# Patient Record
Sex: Female | Born: 1985 | Race: Black or African American | Hispanic: No | Marital: Single | Smoking: Current every day smoker
Health system: Southern US, Community
[De-identification: ages and names within clinical notes are randomized; demographics above are authoritative.]

## PROBLEM LIST (undated history)

## (undated) DIAGNOSIS — I10 Essential (primary) hypertension: Secondary | ICD-10-CM

## (undated) DIAGNOSIS — Z789 Other specified health status: Secondary | ICD-10-CM

## (undated) DIAGNOSIS — B9689 Other specified bacterial agents as the cause of diseases classified elsewhere: Secondary | ICD-10-CM

## (undated) DIAGNOSIS — N760 Acute vaginitis: Secondary | ICD-10-CM

## (undated) HISTORY — PX: BREAST REDUCTION SURGERY: SHX8

---

## 2010-11-23 ENCOUNTER — Inpatient Hospital Stay (HOSPITAL_COMMUNITY)
Admission: AD | Admit: 2010-11-23 | Discharge: 2010-11-23 | Disposition: A | Discharge status: Home / Self Care | Payer: Medicaid Other | Source: Ambulatory Visit | Attending: Obstetrics and Gynecology | Admitting: Obstetrics and Gynecology

## 2010-11-23 ENCOUNTER — Inpatient Hospital Stay (HOSPITAL_COMMUNITY): Payer: Medicaid Other

## 2010-11-23 DIAGNOSIS — O209 Hemorrhage in early pregnancy, unspecified: Secondary | ICD-10-CM

## 2010-11-23 DIAGNOSIS — O4690 Antepartum hemorrhage, unspecified, unspecified trimester: Secondary | ICD-10-CM

## 2010-11-23 LAB — CBC
HCT: 35.9 % — ABNORMAL LOW (ref 36.0–46.0)
MCH: 28.8 pg (ref 26.0–34.0)
MCV: 86.9 fL (ref 78.0–100.0)
Platelets: 260 10*3/uL (ref 150–400)
RBC: 4.13 MIL/uL (ref 3.87–5.11)

## 2010-11-23 LAB — HCG, QUANTITATIVE, PREGNANCY: hCG, Beta Chain, Quant, S: 3380 m[IU]/mL — ABNORMAL HIGH (ref <5)

## 2010-11-23 LAB — WET PREP, GENITAL

## 2010-11-24 LAB — GC/CHLAMYDIA PROBE AMP, GENITAL
Chlamydia, DNA Probe: POSITIVE — AB
GC Probe Amp, Genital: NEGATIVE

## 2010-11-25 ENCOUNTER — Inpatient Hospital Stay (HOSPITAL_COMMUNITY)
Admission: AD | Admit: 2010-11-25 | Discharge: 2010-11-25 | Disposition: A | Discharge status: Home / Self Care | Payer: Medicaid Other | Source: Ambulatory Visit | Attending: Family Medicine | Admitting: Family Medicine

## 2010-11-25 DIAGNOSIS — O021 Missed abortion: Secondary | ICD-10-CM

## 2010-12-02 ENCOUNTER — Inpatient Hospital Stay (HOSPITAL_COMMUNITY)
Admission: AD | Admit: 2010-12-02 | Discharge: 2010-12-02 | Disposition: A | Discharge status: Home / Self Care | Payer: Medicaid Other | Source: Ambulatory Visit | Attending: Obstetrics & Gynecology | Admitting: Obstetrics & Gynecology

## 2010-12-02 DIAGNOSIS — O200 Threatened abortion: Secondary | ICD-10-CM

## 2010-12-20 ENCOUNTER — Encounter: Payer: Medicaid Other | Admitting: Obstetrics and Gynecology

## 2010-12-29 ENCOUNTER — Other Ambulatory Visit: Payer: Self-pay | Admitting: Advanced Practice Midwife

## 2010-12-29 ENCOUNTER — Encounter: Payer: Medicaid Other | Admitting: Advanced Practice Midwife

## 2010-12-29 DIAGNOSIS — O039 Complete or unspecified spontaneous abortion without complication: Secondary | ICD-10-CM

## 2010-12-29 NOTE — Group Therapy Note (Unsigned)
Kristine Gomez, Kristine Gomez                 ACCOUNT NO.:  [id number]  MEDICAL RECORD NO.:  [id number]           PATIENT TYPE:  A  LOCATION:  [facility]                   FACILITY:  [facility]  PHYSICIAN:  Wynelle Bourgeois, CNM    DATE OF BIRTH:  June 21, 1986  DATE OF SERVICE:  12/29/2010                                 CLINIC NOTE  This is a 25 year old gravida 1, para 0-0-1-0, who is post SAB.  She was seen in early April for first semester bleeding.  Her initial quantitative hCG was 3380 and had an ultrasound showing an irregular gestational sac.  Her followup visit there had a HCG level of 433.  She was informed that this represented a miscarriage and so she was referred to our clinic for followup.  She has since then stopped bleeding and is not having any pain.  She is requesting OCPs and does not want to try to get pregnant again.  She also wants to be tested for Chlamydia because she had a positive test a month ago and was treated but her boyfriend did not get treated and they had intercourse again.  OBJECTIVE DATA:  VITAL SIGNS:  Temperature 97.4, pulse 87, blood pressure 137/94, weight 263.1, height 63 inches.  ALLERGIES:  None.  MENSTRUAL HISTORY:  Period began at age 82.  Periods are regular and heavy.  She has had only the one pregnancy with the miscarriage.  She has never had an abnormal Pap smear.  She has had Chlamydia a month ago.  PAST MEDICAL HISTORY:  Remarkable for heart murmur and asthma.  PAST SURGICAL HISTORY:  Remarkable for breast reduction.  SOCIAL HISTORY:  The patient is a one pack per day smoker for 10 years, social drinker, and no drugs.  FAMILY HISTORY:  Remarkable for diabetes, heart disease, and blood clots.  REVIEW OF SYSTEMS:  GENERAL:  Occasional migraine headaches, weakness in her right arm at times, and some urinary incontinence.  CHEST:  Clear to auscultation.  HEART:  Regular rate and rhythm.  BREASTS:  Soft and nontender.  No masses noted.  ABDOMEN:   Soft and nontender and obese. GENITOURINARY:  EG/BUS within normal limits.  Vagina clear, well rugated with no discharge.  Cervix is nulliparous, closed, and long.  Uterus is small and well involuted.  Adnexa are not palpable secondary to habitus.  ASSESSMENT AND PLAN: 1. Status post complete spontaneous abortion, and we will repeat a     quantitative HCG today, repeat quantitative HCG until zero. 2. Desires contraception.  I discussed various methods, and the     patient wants to proceed with oral contraceptives, prescription for     Ovcon 35 given for 1 year. 3. Migraine headaches.  The patient is referred to [facility] and     will use ibuprofen for pain relief. 4. Right arm numbness, intermittent, referred to [facility] for     evaluation.          ______________________________ Wynelle Bourgeois, CNM    MW/MEDQ  D:  12/29/2010  T:  12/29/2010  Job:  [id number]

## 2011-01-09 ENCOUNTER — Other Ambulatory Visit: Payer: Medicaid Other

## 2011-01-09 DIAGNOSIS — Z0189 Encounter for other specified special examinations: Secondary | ICD-10-CM

## 2011-01-18 ENCOUNTER — Other Ambulatory Visit: Payer: Medicaid Other

## 2011-01-18 DIAGNOSIS — Z0189 Encounter for other specified special examinations: Secondary | ICD-10-CM

## 2011-01-24 ENCOUNTER — Other Ambulatory Visit: Payer: Medicaid Other

## 2011-02-21 ENCOUNTER — Other Ambulatory Visit: Payer: Medicaid Other

## 2011-02-21 DIAGNOSIS — Z0189 Encounter for other specified special examinations: Secondary | ICD-10-CM

## 2011-03-19 ENCOUNTER — Encounter (HOSPITAL_COMMUNITY): Payer: Self-pay | Admitting: *Deleted

## 2011-03-19 ENCOUNTER — Inpatient Hospital Stay (HOSPITAL_COMMUNITY)
Admission: AD | Admit: 2011-03-19 | Discharge: 2011-03-19 | Disposition: A | Discharge status: Home / Self Care | Payer: Medicaid Other | Source: Ambulatory Visit | Attending: Obstetrics & Gynecology | Admitting: Obstetrics & Gynecology

## 2011-03-19 DIAGNOSIS — N760 Acute vaginitis: Secondary | ICD-10-CM

## 2011-03-19 DIAGNOSIS — N926 Irregular menstruation, unspecified: Secondary | ICD-10-CM | POA: Insufficient documentation

## 2011-03-19 DIAGNOSIS — N939 Abnormal uterine and vaginal bleeding, unspecified: Secondary | ICD-10-CM | POA: Insufficient documentation

## 2011-03-19 DIAGNOSIS — A499 Bacterial infection, unspecified: Secondary | ICD-10-CM | POA: Insufficient documentation

## 2011-03-19 DIAGNOSIS — F17200 Nicotine dependence, unspecified, uncomplicated: Secondary | ICD-10-CM | POA: Insufficient documentation

## 2011-03-19 DIAGNOSIS — B9689 Other specified bacterial agents as the cause of diseases classified elsewhere: Secondary | ICD-10-CM | POA: Insufficient documentation

## 2011-03-19 HISTORY — DX: Acute vaginitis: N76.0

## 2011-03-19 HISTORY — DX: Other specified health status: Z78.9

## 2011-03-19 HISTORY — DX: Other specified bacterial agents as the cause of diseases classified elsewhere: B96.89

## 2011-03-19 HISTORY — DX: Essential (primary) hypertension: I10

## 2011-03-19 LAB — WET PREP, GENITAL: Trich, Wet Prep: NONE SEEN

## 2011-03-19 LAB — URINALYSIS, ROUTINE W REFLEX MICROSCOPIC
Bilirubin Urine: NEGATIVE
Glucose, UA: NEGATIVE mg/dL
Hgb urine dipstick: NEGATIVE
Ketones, ur: NEGATIVE mg/dL
Leukocytes, UA: NEGATIVE
Protein, ur: NEGATIVE mg/dL
pH: 5.5 (ref 5.0–8.0)

## 2011-03-19 MED ORDER — METRONIDAZOLE 500 MG PO TABS: 500.0000 mg | ORAL_TABLET | Freq: Two times a day (BID) | ORAL | Status: AC

## 2011-03-19 NOTE — ED Provider Notes (Signed)
History   Chief Complaint: Vaginal Spotting  Kristine Gomez is  25 y.o. G1P0 Patient's last menstrual period was 03/15/2011.Marland Kitchen  Her pregnancy status is negative.    She presents complaining irregular vaginal bleeding and spotting since SAB in March. Pt reports she has been coming to [facility] at [facility] to follow quants. Her last quant was drawn ~3 weeks ago, however, she does not know the results. Pt states that she had strong cramping and heavy bleeding that started on Friday and that she passed a "hard egg-shape mass" vaginal yesterday. Denies pain and spotting today. Presents to MAU due to concern that "one of my organs fell out".   RN in [facility] verified last quant (begining of July) <2  OB History    Grav Para Term Preterm Abortions TAB SAB Ect Mult Living   1         0       Past Medical History  Diagnosis Date  . Asthma   . Hypertension   . No pertinent past medical history   . Bacterial vaginal infection 03/19/2011    Past Surgical History  Procedure Date  . Breast reduction surgery     25 YRS OLD    No family history on file.  History  Substance Use Topics  . Smoking status: Current Everyday Smoker -- 10.0 packs/day  . Smokeless tobacco: Not on file  . Alcohol Use: Yes    Allergies:  Allergies  Allergen Reactions  . Coconut (Nuts) Hives  . Mushroom Extract Complex Hives    No prescriptions prior to admission    Review of Systems - Negative except what has been reviwed in HPI History obtained from the patient  Physical Exam   Blood pressure 122/74, temperature 98.6 F (37 C), temperature source Oral, resp. rate 20, height 5\' 3"  (1.6 m), weight 266 lb (120.657 kg), last menstrual period 03/15/2011, SpO2 99.00%.  General: General appearance - alert, well appearing, and in no distress, oriented to person, place, and time and overweight Mental status - alert, oriented to person, place, and time, normal mood, behavior, speech, dress, motor activity, and thought  processes Abdomen - soft, nontender, nondistended, no masses or organomegaly Focused Gynecological Exam: normal external genitalia, vulva, vagina, cervix, uterus and adnexa, no blding noted in vault. Uterus/adnexa: nonenlarged & nontender  Labs: Recent Results (from the past 24 hour(s))  URINALYSIS, ROUTINE W REFLEX MICROSCOPIC   Collection Time   03/19/11  1:20 PM      Component Value Range   Color, Urine YELLOW  YELLOW    Appearance HAZY (*) CLEAR    Specific Gravity, Urine >1.030 (*) 1.005 - 1.030    pH 5.5  5.0 - 8.0    Glucose, UA NEGATIVE  NEGATIVE (mg/dL)   Hgb urine dipstick NEGATIVE  NEGATIVE    Bilirubin Urine NEGATIVE  NEGATIVE    Ketones, ur NEGATIVE  NEGATIVE (mg/dL)   Protein, ur NEGATIVE  NEGATIVE (mg/dL)   Urobilinogen, UA 0.2  0.0 - 1.0 (mg/dL)   Nitrite NEGATIVE  NEGATIVE    Leukocytes, UA NEGATIVE  NEGATIVE   POCT PREGNANCY, URINE   Collection Time   03/19/11  1:25 PM      Component Value Range   Preg Test, Ur NEGATIVE    WET PREP, GENITAL   Collection Time   03/19/11  2:53 PM      Component Value Range   Yeast, Wet Prep NONE SEEN  NONE SEEN    Trich, Wet  Prep NONE SEEN  NONE SEEN    Clue Cells, Wet Prep FEW (*) NONE SEEN    WBC, Wet Prep HPF POC MODERATE (*) NONE SEEN   GC/CHLAMYDIA PROBE AMP, GENITAL   Collection Time   03/19/11  2:54 PM      Component Value Range   GC Probe Amp, Genital NEGATIVE  NEGATIVE    Chlamydia, DNA Probe NEGATIVE  NEGATIVE      Assessment: Patient Active Problem List  Diagnoses  . Bacterial vaginal infection    Plan: D/C Home Rx Flagyl 500mg  po BID x 7 days, ETOH precautions reviewed Discharge Medications: Current Discharge Medication List    CONTINUE these medications which have NOT CHANGED   Details  ibuprofen (ADVIL,MOTRIN) 800 MG tablet Take 800 mg by mouth every 8 (eight) hours as needed. PATIENT TAKES FOR PAIN          Shanena Pellegrino E. 03/20/2011, 9:21 AM

## 2011-03-19 NOTE — Progress Notes (Addendum)
PT C/O VAGINAL BLEEDING ON AND OFF FOR X2 MONTHS SINCE HER MISCARRIAGE IN MARCH 2012 ONLY SPOTTING AT THIS TIME . LOWER ABDOMINAL CRAMPING TAKING IBUPROFEN 800MG  WITH MINIMAL RELIEF. PT WAS GIVEN A RX BC NOT TAKING AND USING NOTHING FOR BC.

## 2011-03-20 LAB — GC/CHLAMYDIA PROBE AMP, GENITAL
Chlamydia, DNA Probe: NEGATIVE
GC Probe Amp, Genital: NEGATIVE

## 2012-07-13 ENCOUNTER — Encounter (HOSPITAL_BASED_OUTPATIENT_CLINIC_OR_DEPARTMENT_OTHER): Payer: Self-pay | Admitting: *Deleted

## 2012-07-13 ENCOUNTER — Emergency Department (HOSPITAL_BASED_OUTPATIENT_CLINIC_OR_DEPARTMENT_OTHER)
Admission: EM | Admit: 2012-07-13 | Discharge: 2012-07-13 | Disposition: A | Discharge status: Home / Self Care | Payer: Self-pay | Attending: Emergency Medicine | Admitting: Emergency Medicine

## 2012-07-13 DIAGNOSIS — Z8742 Personal history of other diseases of the female genital tract: Secondary | ICD-10-CM | POA: Insufficient documentation

## 2012-07-13 DIAGNOSIS — I10 Essential (primary) hypertension: Secondary | ICD-10-CM | POA: Insufficient documentation

## 2012-07-13 DIAGNOSIS — F17200 Nicotine dependence, unspecified, uncomplicated: Secondary | ICD-10-CM | POA: Insufficient documentation

## 2012-07-13 DIAGNOSIS — K0889 Other specified disorders of teeth and supporting structures: Secondary | ICD-10-CM

## 2012-07-13 DIAGNOSIS — K089 Disorder of teeth and supporting structures, unspecified: Secondary | ICD-10-CM | POA: Insufficient documentation

## 2012-07-13 DIAGNOSIS — J45909 Unspecified asthma, uncomplicated: Secondary | ICD-10-CM | POA: Insufficient documentation

## 2012-07-13 DIAGNOSIS — K047 Periapical abscess without sinus: Secondary | ICD-10-CM | POA: Insufficient documentation

## 2012-07-13 MED ORDER — AMOXICILLIN 500 MG PO CAPS: 500.0000 mg | ORAL_CAPSULE | Freq: Three times a day (TID) | ORAL | Status: DC

## 2012-07-13 MED ORDER — HYDROCODONE-ACETAMINOPHEN 5-325 MG PO TABS
2.0000 | ORAL_TABLET | Freq: Once | ORAL | Status: AC
  Administered 2012-07-13: 2 via ORAL
  Filled 2012-07-13: qty 2

## 2012-07-13 MED ORDER — HYDROCODONE-ACETAMINOPHEN 5-500 MG PO TABS: 1.0000 | ORAL_TABLET | Freq: Four times a day (QID) | ORAL | Status: DC | PRN

## 2012-07-13 NOTE — ED Provider Notes (Signed)
History     CSN: [id number]  Arrival date & time 07/13/12  1406   First MD Initiated Contact with Patient 07/13/12 1436      Chief Complaint  Patient presents with  . Dental Pain    (Consider location/radiation/quality/duration/timing/severity/associated sxs/prior treatment) Patient is a 26 y.o. female presenting with tooth pain. The history is provided by the patient.  Dental PainPrimary symptoms do not include headaches, fever, shortness of breath or sore throat.  Additional symptoms do not include: trouble swallowing.  pt c/o right upper dental pain and swelling for past week. Constant. Dull. Moderate-severe. Worse w eating. Denies injury. No throat, neck or floor of mouth pain or swelling. No fever or chills. No trouble breathing or swallowing. Has no local dentist.     Past Medical History  Diagnosis Date  . Asthma   . Hypertension   . No pertinent past medical history   . Bacterial vaginal infection 03/19/2011    Past Surgical History  Procedure Date  . Breast reduction surgery     26 YRS OLD    History reviewed. No pertinent family history.  History  Substance Use Topics  . Smoking status: Current Every Day Smoker -- 10.0 packs/day  . Smokeless tobacco: Not on file  . Alcohol Use: Yes    OB History    Grav Para Term Preterm Abortions TAB SAB Ect Mult Living   1         0      Review of Systems  Constitutional: Negative for fever.  HENT: Negative for sore throat and trouble swallowing.   Respiratory: Negative for shortness of breath.   Neurological: Negative for headaches.    Allergies  Coconut and Mushroom extract complex  Home Medications   Current Outpatient Rx  Name  Route  Sig  Dispense  Refill  . IBUPROFEN 800 MG PO TABS   Oral   Take 800 mg by mouth every 8 (eight) hours as needed. PATIENT TAKES FOR PAIN            BP 175/99  Pulse 92  Temp 99.1 F (37.3 C) (Oral)  Resp 20  Ht 5\' 3"  (1.6 m)  Wt 260 lb (117.935 kg)  BMI 46.06  kg/m2  SpO2 100%  LMP 06/12/2012  Physical Exam  Nursing note and vitals reviewed. Constitutional: She appears well-developed and well-nourished. No distress.  HENT:  Nose: Nose normal.  Mouth/Throat: Oropharynx is clear and moist.       Right upper dental tenderness, associated gum swelling, mild. No facial swelling. No trismus. No swelling or tenderness to floor of mouth, throat or neck.   Eyes: Conjunctivae normal are normal. No scleral icterus.  Neck: Neck supple. No tracheal deviation present.  Cardiovascular: Normal rate.   Pulmonary/Chest: Effort normal. No respiratory distress.  Abdominal: Normal appearance.  Musculoskeletal: She exhibits no edema.  Neurological: She is alert.  Skin: Skin is warm and dry. No rash noted.  Psychiatric: She has a normal mood and affect.    ED Course  Procedures (including critical care time)     MDM  Pt has ride, does not have to drive. No meds pta.   vicodin 2 po. Confirmed nkda w pt.  rx amox, vicodin, dental f/u.         Suzi Roots, MD 07/13/12 [phone]

## 2012-07-13 NOTE — ED Notes (Signed)
Pt c/o dental pain x 2 weeks. Worse yest.

## 2012-09-10 LAB — OB RESULTS CONSOLE HGB/HCT, BLOOD: Hemoglobin: 11.2 g/dL

## 2012-09-17 ENCOUNTER — Encounter: Payer: Self-pay | Admitting: *Deleted

## 2012-10-06 ENCOUNTER — Encounter: Payer: Medicaid Other | Admitting: Obstetrics & Gynecology

## 2012-10-14 ENCOUNTER — Other Ambulatory Visit: Payer: Self-pay

## 2012-10-15 ENCOUNTER — Other Ambulatory Visit (HOSPITAL_COMMUNITY): Payer: Self-pay | Admitting: Obstetrics and Gynecology

## 2012-10-23 ENCOUNTER — Ambulatory Visit (HOSPITAL_COMMUNITY)
Admission: RE | Admit: 2012-10-23 | Discharge: 2012-10-23 | Disposition: A | Discharge status: Home / Self Care | Payer: Medicaid Other | Source: Ambulatory Visit | Attending: Obstetrics and Gynecology | Admitting: Obstetrics and Gynecology

## 2012-10-23 VITALS — BP 148/88 | HR 98 | Wt 289.8 lb

## 2012-10-23 DIAGNOSIS — Z363 Encounter for antenatal screening for malformations: Secondary | ICD-10-CM | POA: Insufficient documentation

## 2012-10-23 DIAGNOSIS — O99330 Smoking (tobacco) complicating pregnancy, unspecified trimester: Secondary | ICD-10-CM | POA: Insufficient documentation

## 2012-10-23 DIAGNOSIS — O99810 Abnormal glucose complicating pregnancy: Secondary | ICD-10-CM | POA: Insufficient documentation

## 2012-10-23 DIAGNOSIS — Z1389 Encounter for screening for other disorder: Secondary | ICD-10-CM | POA: Insufficient documentation

## 2012-10-23 DIAGNOSIS — O358XX0 Maternal care for other (suspected) fetal abnormality and damage, not applicable or unspecified: Secondary | ICD-10-CM | POA: Insufficient documentation

## 2012-10-23 DIAGNOSIS — O99210 Obesity complicating pregnancy, unspecified trimester: Secondary | ICD-10-CM

## 2012-10-23 DIAGNOSIS — O24419 Gestational diabetes mellitus in pregnancy, unspecified control: Secondary | ICD-10-CM

## 2012-11-20 ENCOUNTER — Ambulatory Visit (HOSPITAL_COMMUNITY)
Admission: RE | Admit: 2012-11-20 | Discharge: 2012-11-20 | Disposition: A | Discharge status: Home / Self Care | Payer: Medicaid Other | Source: Ambulatory Visit | Attending: Obstetrics and Gynecology | Admitting: Obstetrics and Gynecology

## 2012-11-20 ENCOUNTER — Encounter (HOSPITAL_COMMUNITY): Payer: Self-pay

## 2012-11-20 VITALS — BP 129/83 | HR 93 | Wt 286.5 lb

## 2012-11-20 DIAGNOSIS — O99330 Smoking (tobacco) complicating pregnancy, unspecified trimester: Secondary | ICD-10-CM | POA: Insufficient documentation

## 2012-11-20 DIAGNOSIS — O358XX0 Maternal care for other (suspected) fetal abnormality and damage, not applicable or unspecified: Secondary | ICD-10-CM

## 2012-11-20 DIAGNOSIS — O99210 Obesity complicating pregnancy, unspecified trimester: Secondary | ICD-10-CM

## 2012-11-20 DIAGNOSIS — O99212 Obesity complicating pregnancy, second trimester: Secondary | ICD-10-CM

## 2012-11-20 DIAGNOSIS — O99810 Abnormal glucose complicating pregnancy: Secondary | ICD-10-CM | POA: Insufficient documentation

## 2012-11-20 DIAGNOSIS — O24419 Gestational diabetes mellitus in pregnancy, unspecified control: Secondary | ICD-10-CM

## 2012-11-20 NOTE — Progress Notes (Signed)
Kristine Gomez  was seen today for an ultrasound appointment.  See full report in [facility].  Impression: Single IUP at 22 5/7 weeks Normal interval anatomy- anatomic survey is now complete Interval growth is appropriate (60th %tile) Normal amniotic fluid volume  Recommendations: Recommend follow-up ultrasound examination in 4 weeks for interval growth  Alpha Gula, MD

## 2012-12-18 ENCOUNTER — Encounter (HOSPITAL_COMMUNITY): Payer: Self-pay

## 2012-12-18 ENCOUNTER — Ambulatory Visit (HOSPITAL_COMMUNITY)
Admission: RE | Admit: 2012-12-18 | Discharge: 2012-12-18 | Disposition: A | Discharge status: Home / Self Care | Payer: Medicaid Other | Source: Ambulatory Visit | Attending: Obstetrics and Gynecology | Admitting: Obstetrics and Gynecology

## 2012-12-18 VITALS — BP 131/80 | HR 106 | Wt 280.0 lb

## 2012-12-18 DIAGNOSIS — O24419 Gestational diabetes mellitus in pregnancy, unspecified control: Secondary | ICD-10-CM

## 2012-12-18 DIAGNOSIS — O99212 Obesity complicating pregnancy, second trimester: Secondary | ICD-10-CM

## 2012-12-18 DIAGNOSIS — O99810 Abnormal glucose complicating pregnancy: Secondary | ICD-10-CM | POA: Insufficient documentation

## 2012-12-18 DIAGNOSIS — O99330 Smoking (tobacco) complicating pregnancy, unspecified trimester: Secondary | ICD-10-CM | POA: Insufficient documentation

## 2012-12-18 NOTE — Progress Notes (Signed)
Kristine Gomez  was seen today for an ultrasound appointment.  See full report in [facility].  Comments: Kristine Gomez returns for follow up growth due to A2 GDM.  She is currently on eveing NPH and reports that her blood sugars are well-controlled.  Impression: Single IUP at 26 5/7 weeks Normal interval anatomy- anatomic survey is now complete Interval growth is appropriate (63th %tile) Normal amniotic fluid volume  Recommendations: Recommend follow-up ultrasound examination in 4 weeks for interval growth  Alpha Gula, MD

## 2013-01-15 ENCOUNTER — Ambulatory Visit (HOSPITAL_COMMUNITY)
Admission: RE | Admit: 2013-01-15 | Discharge: 2013-01-15 | Disposition: A | Discharge status: Home / Self Care | Payer: Medicaid Other | Source: Ambulatory Visit | Attending: Obstetrics and Gynecology | Admitting: Obstetrics and Gynecology

## 2013-01-15 DIAGNOSIS — E669 Obesity, unspecified: Secondary | ICD-10-CM | POA: Insufficient documentation

## 2013-01-15 DIAGNOSIS — O99810 Abnormal glucose complicating pregnancy: Secondary | ICD-10-CM | POA: Insufficient documentation

## 2013-01-15 DIAGNOSIS — O24419 Gestational diabetes mellitus in pregnancy, unspecified control: Secondary | ICD-10-CM

## 2013-01-15 DIAGNOSIS — O99330 Smoking (tobacco) complicating pregnancy, unspecified trimester: Secondary | ICD-10-CM | POA: Insufficient documentation

## 2013-01-15 DIAGNOSIS — O99212 Obesity complicating pregnancy, second trimester: Secondary | ICD-10-CM

## 2013-02-09 ENCOUNTER — Other Ambulatory Visit (HOSPITAL_COMMUNITY): Payer: Self-pay | Admitting: Obstetrics and Gynecology

## 2013-02-09 DIAGNOSIS — E669 Obesity, unspecified: Secondary | ICD-10-CM

## 2013-02-09 DIAGNOSIS — O99810 Abnormal glucose complicating pregnancy: Secondary | ICD-10-CM

## 2013-02-12 ENCOUNTER — Ambulatory Visit (HOSPITAL_COMMUNITY)
Admission: RE | Admit: 2013-02-12 | Discharge: 2013-02-12 | Disposition: A | Discharge status: Home / Self Care | Payer: Medicaid Other | Source: Ambulatory Visit | Attending: Obstetrics and Gynecology | Admitting: Obstetrics and Gynecology

## 2013-02-12 ENCOUNTER — Encounter (HOSPITAL_COMMUNITY): Payer: Self-pay

## 2013-02-12 DIAGNOSIS — O99210 Obesity complicating pregnancy, unspecified trimester: Secondary | ICD-10-CM

## 2013-02-12 DIAGNOSIS — E669 Obesity, unspecified: Secondary | ICD-10-CM | POA: Insufficient documentation

## 2013-02-12 DIAGNOSIS — O99330 Smoking (tobacco) complicating pregnancy, unspecified trimester: Secondary | ICD-10-CM | POA: Insufficient documentation

## 2013-02-12 DIAGNOSIS — O99810 Abnormal glucose complicating pregnancy: Secondary | ICD-10-CM | POA: Insufficient documentation

## 2013-04-17 ENCOUNTER — Emergency Department (HOSPITAL_BASED_OUTPATIENT_CLINIC_OR_DEPARTMENT_OTHER)
Admission: EM | Admit: 2013-04-17 | Discharge: 2013-04-17 | Disposition: A | Discharge status: Home / Self Care | Payer: Medicaid Other | Attending: Emergency Medicine | Admitting: Emergency Medicine

## 2013-04-17 ENCOUNTER — Emergency Department (HOSPITAL_BASED_OUTPATIENT_CLINIC_OR_DEPARTMENT_OTHER): Payer: Medicaid Other

## 2013-04-17 ENCOUNTER — Encounter (HOSPITAL_BASED_OUTPATIENT_CLINIC_OR_DEPARTMENT_OTHER): Payer: Self-pay

## 2013-04-17 DIAGNOSIS — Z8632 Personal history of gestational diabetes: Secondary | ICD-10-CM | POA: Insufficient documentation

## 2013-04-17 DIAGNOSIS — Z3202 Encounter for pregnancy test, result negative: Secondary | ICD-10-CM | POA: Insufficient documentation

## 2013-04-17 DIAGNOSIS — I10 Essential (primary) hypertension: Secondary | ICD-10-CM | POA: Insufficient documentation

## 2013-04-17 DIAGNOSIS — J189 Pneumonia, unspecified organism: Secondary | ICD-10-CM

## 2013-04-17 DIAGNOSIS — Z792 Long term (current) use of antibiotics: Secondary | ICD-10-CM | POA: Insufficient documentation

## 2013-04-17 DIAGNOSIS — J45901 Unspecified asthma with (acute) exacerbation: Secondary | ICD-10-CM | POA: Insufficient documentation

## 2013-04-17 DIAGNOSIS — F17200 Nicotine dependence, unspecified, uncomplicated: Secondary | ICD-10-CM | POA: Insufficient documentation

## 2013-04-17 DIAGNOSIS — J159 Unspecified bacterial pneumonia: Secondary | ICD-10-CM | POA: Insufficient documentation

## 2013-04-17 DIAGNOSIS — Z8742 Personal history of other diseases of the female genital tract: Secondary | ICD-10-CM | POA: Insufficient documentation

## 2013-04-17 DIAGNOSIS — Z8619 Personal history of other infectious and parasitic diseases: Secondary | ICD-10-CM | POA: Insufficient documentation

## 2013-04-17 DIAGNOSIS — Z794 Long term (current) use of insulin: Secondary | ICD-10-CM | POA: Insufficient documentation

## 2013-04-17 LAB — BASIC METABOLIC PANEL
BUN: 6 mg/dL (ref 6–23)
CO2: 24 mEq/L (ref 19–32)
GFR calc non Af Amer: 86 mL/min — ABNORMAL LOW (ref >90)
Glucose, Bld: 96 mg/dL (ref 70–99)
Potassium: 3.6 mEq/L (ref 3.5–5.1)

## 2013-04-17 LAB — CBC WITH DIFFERENTIAL/PLATELET
Eosinophils Absolute: 0.5 10*3/uL (ref 0.0–0.7)
Eosinophils Relative: 6 % — ABNORMAL HIGH (ref 0–5)
Hemoglobin: 11.5 g/dL — ABNORMAL LOW (ref 12.0–15.0)
Lymphocytes Relative: 25 % (ref 12–46)
Lymphs Abs: 2.3 10*3/uL (ref 0.7–4.0)
MCH: 28.3 pg (ref 26.0–34.0)
MCV: 86.5 fL (ref 78.0–100.0)
Monocytes Relative: 7 % (ref 3–12)
Neutrophils Relative %: 63 % (ref 43–77)
RBC: 4.07 MIL/uL (ref 3.87–5.11)

## 2013-04-17 MED ORDER — LEVOFLOXACIN 750 MG PO TABS
750.0000 mg | ORAL_TABLET | Freq: Once | ORAL | Status: AC
  Administered 2013-04-17: 750 mg via ORAL
  Filled 2013-04-17: qty 1

## 2013-04-17 MED ORDER — LEVOFLOXACIN 500 MG PO TABS: 750.0000 mg | ORAL_TABLET | Freq: Every day | ORAL | Status: DC

## 2013-04-17 MED ORDER — ALBUTEROL SULFATE (5 MG/ML) 0.5% IN NEBU
5.0000 mg | INHALATION_SOLUTION | Freq: Once | RESPIRATORY_TRACT | Status: AC
  Administered 2013-04-17: 5 mg via RESPIRATORY_TRACT
  Filled 2013-04-17: qty 1

## 2013-04-17 MED ORDER — IOHEXOL 350 MG/ML SOLN: 100.0000 mL | Freq: Once | INTRAVENOUS | Status: DC | PRN

## 2013-04-17 MED ORDER — ALBUTEROL SULFATE HFA 108 (90 BASE) MCG/ACT IN AERS: 1.0000 | INHALATION_SPRAY | Freq: Four times a day (QID) | RESPIRATORY_TRACT | Status: AC | PRN

## 2013-04-17 MED ORDER — ENOXAPARIN SODIUM 150 MG/ML ~~LOC~~ SOLN
130.0000 mg | Freq: Once | SUBCUTANEOUS | Status: AC
  Administered 2013-04-17: 130 mg via SUBCUTANEOUS
  Filled 2013-04-17: qty 1

## 2013-04-17 MED ORDER — SODIUM CHLORIDE 0.9 % IV BOLUS (SEPSIS)
1000.0000 mL | Freq: Once | INTRAVENOUS | Status: AC
  Administered 2013-04-17: 1000 mL via INTRAVENOUS

## 2013-04-17 NOTE — ED Notes (Signed)
Pt ambulatory to bathroom without difficulty.  

## 2013-04-17 NOTE — ED Notes (Signed)
[facility] here for transport now. 

## 2013-04-17 NOTE — ED Notes (Signed)
Informed PA CT results were back and encouraged her to make a disposition.

## 2013-04-17 NOTE — ED Notes (Signed)
RRT at bedside

## 2013-04-17 NOTE — ED Notes (Signed)
CT is now operational and pt will remain in ED here for CT scan.

## 2013-04-17 NOTE — ED Notes (Signed)
Pt states she will need a ride back to [facility] to get her car. Spoke with Verlon Au, charge nurse at [facility], she assured me pt would either be brought by security or given cab voucher to get back to [facility]. Pt aware and agreeable to this plan.

## 2013-04-17 NOTE — ED Notes (Signed)
C/o nonprod cough-started yesterday-pt NAD

## 2013-04-17 NOTE — ED Notes (Signed)
c section 03/16/13-LMP prior to pregnancy

## 2013-04-17 NOTE — ED Notes (Signed)
Patient transported to CT 

## 2013-04-17 NOTE — ED Provider Notes (Signed)
  Medical screening examination/treatment/procedure(s) were performed by non-physician practitioner and as supervising physician I was immediately available for consultation/collaboration.    Gerhard Munch, MD 04/17/13 2325

## 2013-04-17 NOTE — ED Provider Notes (Signed)
CSN: [id number]     Arrival date & time 04/17/13  1353 History     First MD Initiated Contact with Patient 04/17/13 1410     Chief Complaint  Patient presents with  . Cough   (Consider location/radiation/quality/duration/timing/severity/associated sxs/prior Treatment) HPI Comments: Patient is a 27 year old female with a past medical history of asthma, hypertension, and s/p cesarean section 1 month ago who presents with a nonproductive cough that started yesterday. Patient reports sudden onset and progressive worsening of the symptoms. She reports centrally located chest pain when she starts coughing and deep inspiration triggers the cough. Patient states she feels like she did when she had bronchitis. Patient has not tried anything for symptoms. No alleviating factors. No recent travel or previous DVT/PE. Patient is a current everyday smoker.    Past Medical History  Diagnosis Date  . Asthma   . Hypertension   . No pertinent past medical history   . Bacterial vaginal infection 03/19/2011  . Gestational diabetes    Past Surgical History  Procedure Laterality Date  . Breast reduction surgery      27 YRS OLD   No family history on file. History  Substance Use Topics  . Smoking status: Current Every Day Smoker -- 10.00 packs/day  . Smokeless tobacco: Not on file  . Alcohol Use: No   OB History   Grav Para Term Preterm Abortions TAB SAB Ect Mult Living   2 0 0 0 1 0 1 0 0 0      Review of Systems  Respiratory: Positive for cough.   All other systems reviewed and are negative.    Allergies  Coconut and Mushroom extract complex  Home Medications   Current Outpatient Rx  Name  Route  Sig  Dispense  Refill  . amoxicillin (AMOXIL) 500 MG capsule   Oral   Take 1 capsule (500 mg total) by mouth 3 (three) times daily.   21 capsule   0   . HYDROcodone-acetaminophen (VICODIN) 5-500 MG per tablet   Oral   Take 1-2 tablets by mouth every 6 (six) hours as needed for pain.  20 tablet   0   . ibuprofen (ADVIL,MOTRIN) 800 MG tablet   Oral   Take 800 mg by mouth every 8 (eight) hours as needed. PATIENT TAKES FOR PAIN          . insulin NPH (HUMULIN N,NOVOLIN N) 100 UNIT/ML injection   Subcutaneous   Inject into the skin.          BP 179/106  Pulse 123  Temp(Src) 99.9 F (37.7 C) (Oral)  Resp 20  Ht 5\' 3"  (1.6 m)  SpO2 94%  LMP 04/17/2013  Breastfeeding? Unknown Physical Exam  Nursing note and vitals reviewed. Constitutional: She is oriented to person, place, and time. She appears well-developed and well-nourished. No distress.  HENT:  Head: Normocephalic and atraumatic.  Eyes: Conjunctivae and EOM are normal. Pupils are equal, round, and reactive to light.  Neck: Normal range of motion.  Cardiovascular: Normal rate and regular rhythm.  Exam reveals no gallop and no friction rub.   No murmur heard. Pulmonary/Chest: Effort normal. She has wheezes. She has no rales. She exhibits no tenderness.  Mild expiratory wheezes noted throughout bilateral lung fields.   Abdominal: Soft. She exhibits no distension. There is no tenderness. There is no rebound and no guarding.  Musculoskeletal: Normal range of motion.  Neurological: She is alert and oriented to person, place, and time. Coordination normal.  Speech is goal-oriented. Moves limbs without ataxia.   Skin: Skin is warm and dry.  Psychiatric: She has a normal mood and affect. Her behavior is normal.    ED Course   Procedures (including critical care time)   Date: 04/17/2013  Rate: 113  Rhythm: sinus tachycardia  QRS Axis: left  Intervals: normal  ST/T Wave abnormalities: normal  Conduction Disutrbances:none  Narrative Interpretation: Sinus tachycardia with left axis deviation without previous for comparison  Old EKG Reviewed: none available    Labs Reviewed  CBC WITH DIFFERENTIAL - Abnormal; Notable for the following:    Hemoglobin 11.5 (*)    HCT 35.2 (*)    Eosinophils Relative 6  (*)    All other components within normal limits  BASIC METABOLIC PANEL - Abnormal; Notable for the following:    GFR calc non Af Amer 86 (*)    All other components within normal limits  D-DIMER, QUANTITATIVE - Abnormal; Notable for the following:    D-Dimer, Quant 0.54 (*)    All other components within normal limits  PREGNANCY, URINE   Dg Chest 2 View  04/17/2013   *RADIOLOGY REPORT*  Clinical Data: Cough  CHEST - 2 VIEW  Comparison: None.  Findings:  Lungs clear.  Heart size and pulmonary vascularity are normal.  No adenopathy.  There is lower thoracic levoscoliosis.  IMPRESSION: No edema or consolidation.   Original Report Authenticated By: Bretta Bang, M.D.   Ct Angio Chest Pe W/cm &/or Wo Cm  04/17/2013   *RADIOLOGY REPORT*  Clinical Data: Cough, chest pain and shortness of breath.  History of asthma.  Rule out PE.  CT ANGIOGRAPHY CHEST  Technique:  Multidetector CT imaging of the chest using the standard protocol during bolus administration of intravenous contrast. Multiplanar reconstructed images including MIPs were obtained and reviewed to evaluate the vascular anatomy.  Contrast:  100 ml of Omnipaque 350.  Comparison: Chest x-ray today.  Findings: Lungs are adequately inflated without evidence of effusion or pneumothorax.  There is patchy nodular airspace opacification over the upper lobes left worse than right likely due to infection.  The heart is normal in size.  There is no evidence of pulmonary embolism.  Remaining mediastinal structures are within normal.  Images through the upper abdomen are unremarkable.  IMPRESSION: Patchy nodular airspace process over the upper lobes/apices left worse than right likely representing infection.  No evidence of pulmonary embolism.   Original Report Authenticated By: Elberta Fortis, M.D.   1. CAP (community acquired pneumonia)     MDM  3:29 PM Labs pending. Patient is tachycardic with other vitals stable. Patient will have albuterol  nebulizer. Patient will have fluids.   3:49 PM D-dimer elevated. Patient will have CT angio to rule out PE.   8:56 PM Power was out and CT scan just now completed. CT negative for PE but does show left upper lobe pneumonia. Patient will be treated accordingly. Vitals stable and patient afebrile. Patient will be treated with Levaquin for CAP. Patient is not breast feeding and will be treated with 7 day course of Levaquin.   Emilia Beck, PA-C 04/17/13 2102

## 2014-05-13 ENCOUNTER — Encounter (HOSPITAL_BASED_OUTPATIENT_CLINIC_OR_DEPARTMENT_OTHER): Payer: Self-pay | Admitting: Emergency Medicine

## 2014-05-13 ENCOUNTER — Emergency Department (HOSPITAL_BASED_OUTPATIENT_CLINIC_OR_DEPARTMENT_OTHER)
Admission: EM | Admit: 2014-05-13 | Discharge: 2014-05-13 | Disposition: A | Discharge status: Home / Self Care | Payer: Medicaid Other | Attending: Emergency Medicine | Admitting: Emergency Medicine

## 2014-05-13 DIAGNOSIS — Z87448 Personal history of other diseases of urinary system: Secondary | ICD-10-CM | POA: Insufficient documentation

## 2014-05-13 DIAGNOSIS — J45909 Unspecified asthma, uncomplicated: Secondary | ICD-10-CM | POA: Insufficient documentation

## 2014-05-13 DIAGNOSIS — Z79899 Other long term (current) drug therapy: Secondary | ICD-10-CM | POA: Insufficient documentation

## 2014-05-13 DIAGNOSIS — I10 Essential (primary) hypertension: Secondary | ICD-10-CM | POA: Insufficient documentation

## 2014-05-13 DIAGNOSIS — G43809 Other migraine, not intractable, without status migrainosus: Secondary | ICD-10-CM

## 2014-05-13 DIAGNOSIS — Z791 Long term (current) use of non-steroidal anti-inflammatories (NSAID): Secondary | ICD-10-CM | POA: Insufficient documentation

## 2014-05-13 DIAGNOSIS — R51 Headache: Secondary | ICD-10-CM | POA: Insufficient documentation

## 2014-05-13 DIAGNOSIS — Z794 Long term (current) use of insulin: Secondary | ICD-10-CM | POA: Insufficient documentation

## 2014-05-13 DIAGNOSIS — F17200 Nicotine dependence, unspecified, uncomplicated: Secondary | ICD-10-CM | POA: Insufficient documentation

## 2014-05-13 DIAGNOSIS — Z8619 Personal history of other infectious and parasitic diseases: Secondary | ICD-10-CM | POA: Insufficient documentation

## 2014-05-13 MED ORDER — KETOROLAC TROMETHAMINE 60 MG/2ML IM SOLN
60.0000 mg | Freq: Once | INTRAMUSCULAR | Status: AC
  Administered 2014-05-13: 60 mg via INTRAMUSCULAR
  Filled 2014-05-13: qty 2

## 2014-05-13 NOTE — ED Notes (Signed)
Pt requested work noted-EDP notified

## 2014-05-13 NOTE — ED Provider Notes (Signed)
CSN: [id number]     Arrival date & time 05/13/14  1207 History   First MD Initiated Contact with Patient 05/13/14 1220     Chief Complaint  Patient presents with  . Migraine     (Consider location/radiation/quality/duration/timing/severity/associated sxs/prior Treatment) HPI Comments: Patient with history of migraine headaches. She presents with a 3 day history of headache and is consistent with her previous migraines. She denies any injury or trauma. She denies any visual disturbances. She's been taking ibuprofen at home without relief. She occasionally requires Toradol in the ER which seems to resolve her symptoms.  Patient is a 28 y.o. female presenting with migraines. The history is provided by the patient.  Migraine This is a recurrent problem. Episode onset: 3 days ago. The problem occurs constantly. The problem has been gradually worsening. Nothing aggravates the symptoms. Nothing relieves the symptoms. She has tried nothing for the symptoms. The treatment provided no relief.    Past Medical History  Diagnosis Date  . Asthma   . Hypertension   . No pertinent past medical history   . Bacterial vaginal infection 03/19/2011   Past Surgical History  Procedure Laterality Date  . Breast reduction surgery      28 YRS OLD  . Cesarean section     No family history on file. History  Substance Use Topics  . Smoking status: Current Every Day Smoker -- 10.00 packs/day  . Smokeless tobacco: Not on file  . Alcohol Use: No   OB History   Grav Para Term Preterm Abortions TAB SAB Ect Mult Living       Review of Systems  All other systems reviewed and are negative.     Allergies  Coconut and Mushroom extract complex  Home Medications   Prior to Admission medications   Medication Sig Start Date End Date Taking? Authorizing Provider  albuterol (PROVENTIL HFA;VENTOLIN HFA) 108 (90 BASE) MCG/ACT inhaler Inhale 1-2 puffs into the lungs every 6 (six) hours as  needed for wheezing. 04/17/13   Emilia Beck, PA-C  amoxicillin (AMOXIL) 500 MG capsule Take 1 capsule (500 mg total) by mouth 3 (three) times daily. 07/13/12   Suzi Roots, MD  HYDROcodone-acetaminophen (VICODIN) 5-500 MG per tablet Take 1-2 tablets by mouth every 6 (six) hours as needed for pain. 07/13/12   Suzi Roots, MD  ibuprofen (ADVIL,MOTRIN) 800 MG tablet Take 800 mg by mouth every 8 (eight) hours as needed. PATIENT TAKES FOR PAIN     Historical Provider, MD  insulin NPH (HUMULIN N,NOVOLIN N) 100 UNIT/ML injection Inject into the skin.    Historical Provider, MD  levofloxacin (LEVAQUIN) 500 MG tablet Take 1.5 tablets (750 mg total) by mouth daily. 04/17/13   Kaitlyn Szekalski, PA-C   BP 167/98  Pulse 76  Temp(Src) 98.2 F (36.8 C) (Oral)  Resp 16  Ht  (1.6 m)  Wt 268 lb 12.8 oz (121.927 kg)  BMI 47.63 kg/m2  SpO2 99% Physical Exam  Nursing note and vitals reviewed. Constitutional: She is oriented to person, place, and time. She appears well-developed and well-nourished. No distress.  HENT:  Head: Normocephalic and atraumatic.  Eyes: EOM are normal. Pupils are equal, round, and reactive to light.  There is no papilledema on funduscopic exam  Neck: Normal range of motion. Neck supple.  Pulmonary/Chest: Effort normal. No respiratory distress.  Musculoskeletal: Normal range of motion. She exhibits no edema.  Neurological: She is alert and  oriented to person, place, and time.  Skin: Skin is warm and dry. She is not diaphoretic.    ED Course  Procedures (including critical care time) Labs Review Labs Reviewed - No data to display  Imaging Review No results found.   EKG Interpretation None      MDM   Final diagnoses:  None    Patient feeling better with Toradol. Neurologic exam is nonfocal and I do not feel as though imaging or LP is indicated. She will be discharged with when necessary return.    Geoffery Lyons, MD 05/13/14 1330

## 2014-05-13 NOTE — ED Notes (Signed)
C/o "migraine" x 3 days-pain to back of head and behind eyes-denies n/v/visual disturbance-states last migraine 3 yrs ago- relieved by toradol injection

## 2014-05-13 NOTE — Discharge Instructions (Signed)

## 2014-06-28 ENCOUNTER — Encounter (HOSPITAL_BASED_OUTPATIENT_CLINIC_OR_DEPARTMENT_OTHER): Payer: Self-pay | Admitting: Emergency Medicine

## 2014-08-26 IMAGING — US US OB FOLLOW-UP
1 series · 12 of 24 positions shown · non-contrast
Comparison: none

[Series 1: us ob follow-up · 0.23mm/px · 12 of 24 slices shown]
[im 2/24]
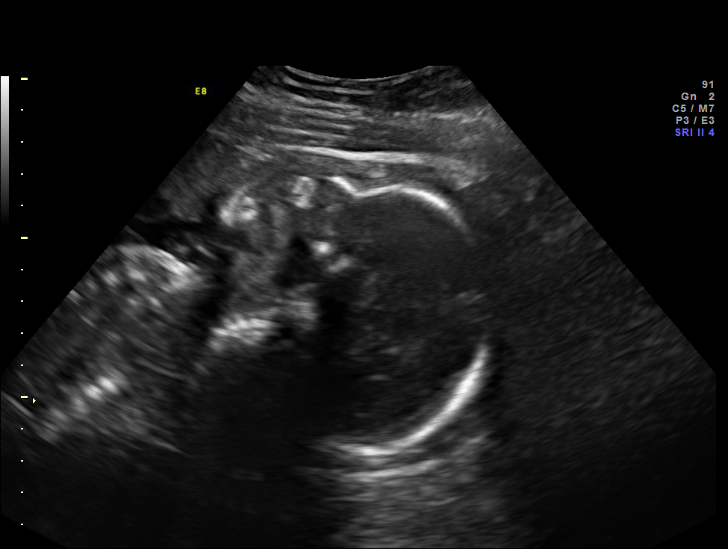
[im 4/24]
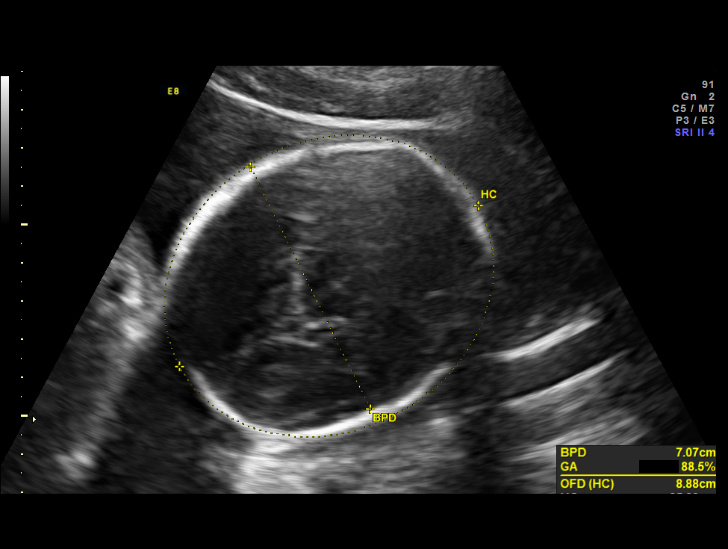
[im 6/24]
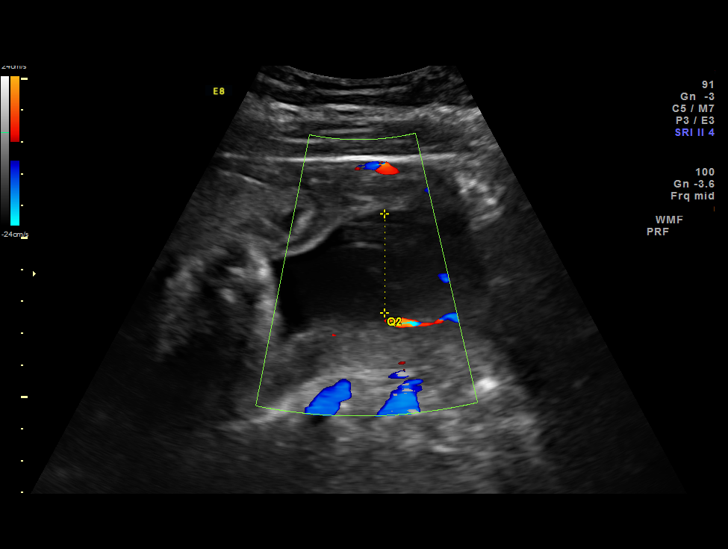
[im 8/24]
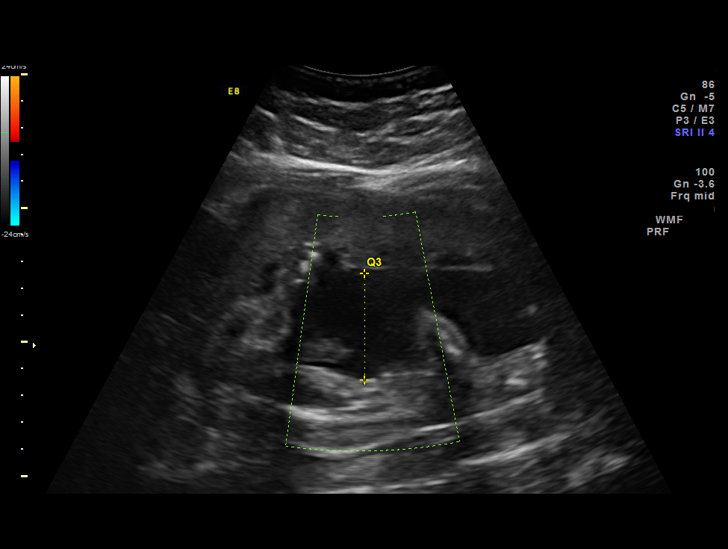
[im 10/24]
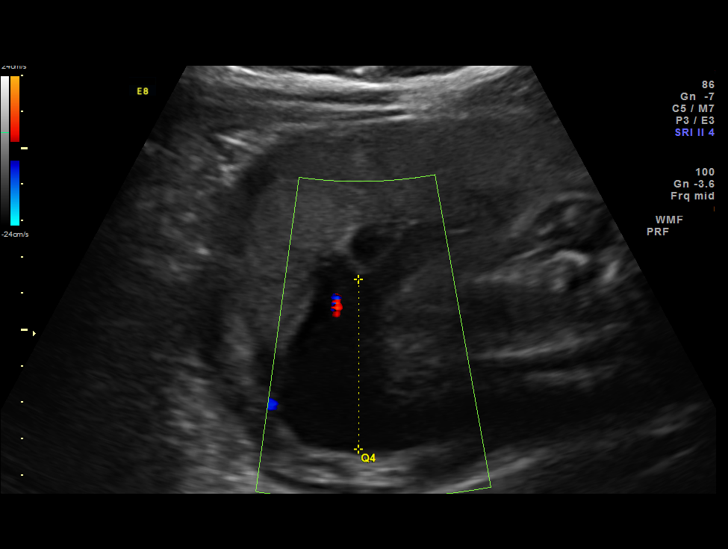
[im 12/24]
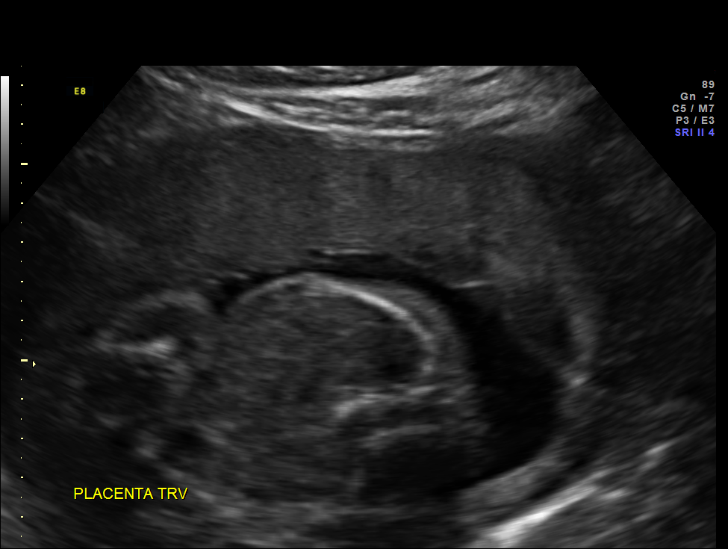
[im 14/24]
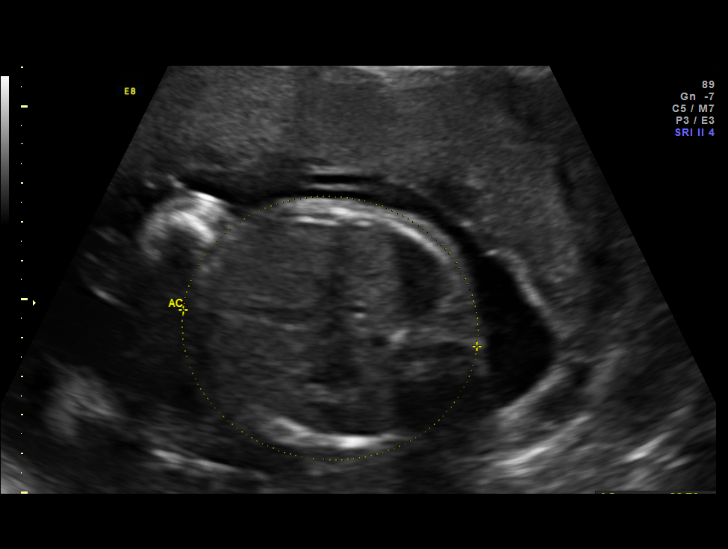
[im 16/24]
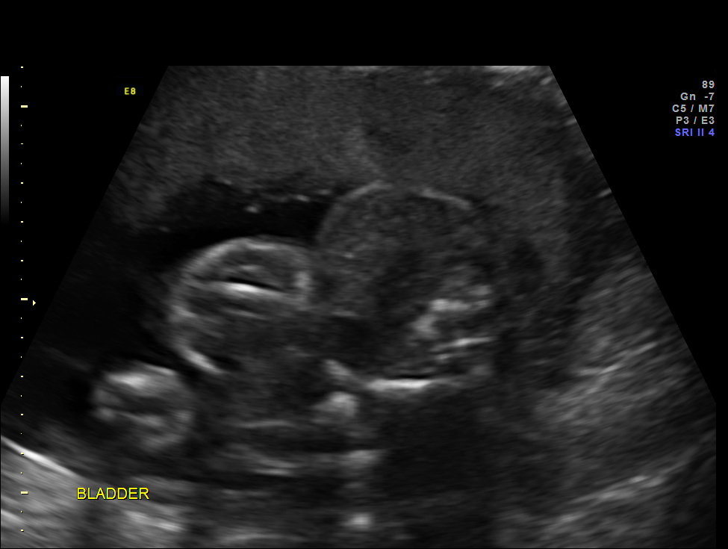
[im 18/24]
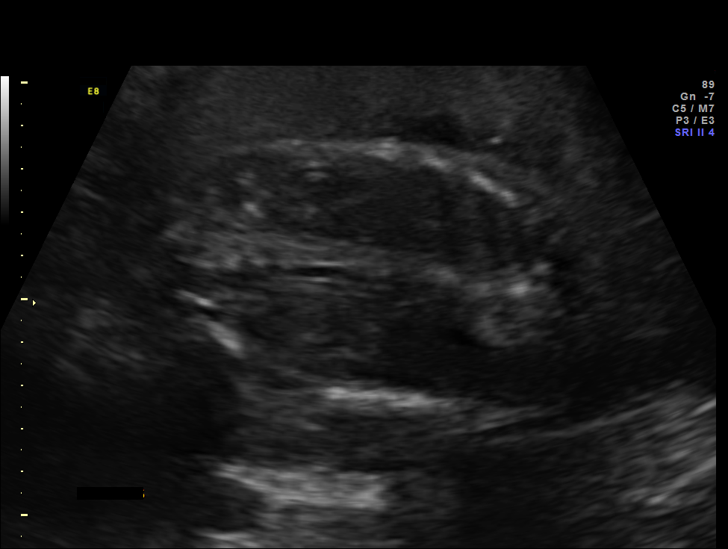
[im 20/24]
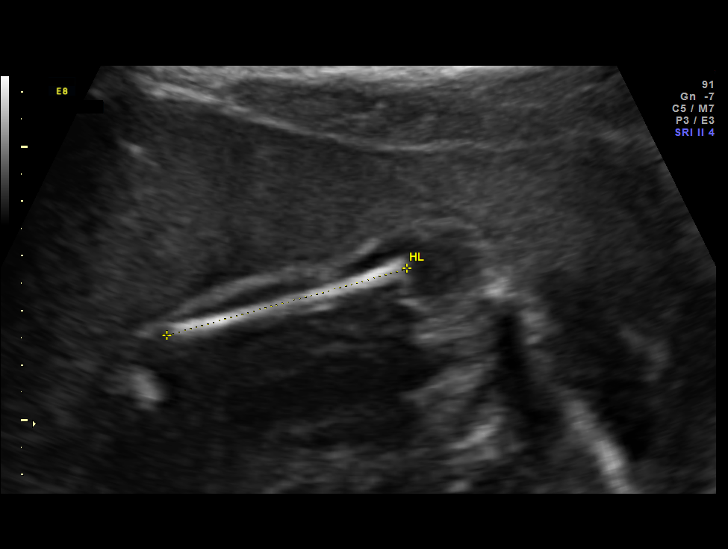
[im 22/24]
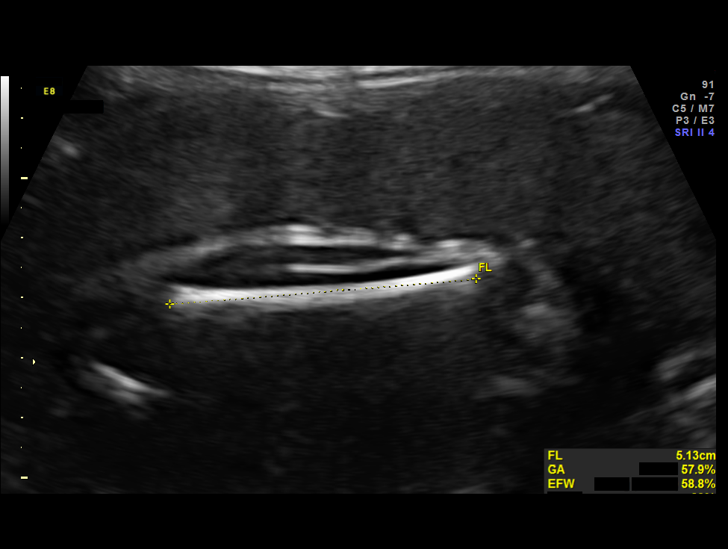
[im 24/24]
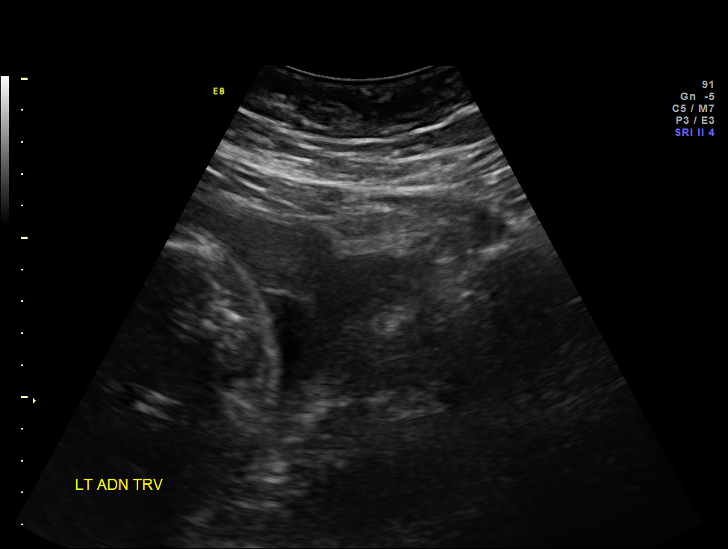

[12 of 24 positions shown; findings below may reference images not displayed]

OBSTETRICS REPORT
                      (Signed Final 12/18/2012 [DATE])

Service(s) Provided

 US OB FOLLOW UP                                       76816.1
Indications

 Diabetes - Gestational, A2 (medication controlled)
 Cigarette smoker
Fetal Evaluation

 Num Of Fetuses:    1
 Fetal Heart Rate:  152                          bpm
 Cardiac Activity:  Observed
 Presentation:      Cephalic
 Placenta:          Anterior, above cervical os
 P. Cord            Previously Visualized
 Insertion:

 Amniotic Fluid
 AFI FV:      Subjectively within normal limits
 AFI Sum:     16.56   cm       61  %Tile     Larg Pckt:    4.79  cm
 RUQ:   4.79    cm   RLQ:    4.68   cm    LUQ:   3.11    cm   LLQ:    3.98   cm
Biometry

 BPD:     70.3  mm     G. Age:  28w 2d                CI:         79.1   70 - 86
 OFD:     88.9  mm                                    FL/HC:      19.9   18.6 -

 HC:     258.5  mm     G. Age:  28w 1d       69  %    HC/AC:      1.16   1.05 -

 AC:     222.1  mm     G. Age:  26w 4d       40  %    FL/BPD:     73.3   71 - 87
 FL:      51.5  mm     G. Age:  27w 4d       60  %    FL/AC:      23.2   20 - 24
 HUM:     45.2  mm     G. Age:  26w 5d       48  %

 Est. FW:    8480  gm      2 lb 5 oz     63  %
Gestational Age

 LMP:           26w 5d        Date:  06/14/12                 EDD:   03/21/13
 U/S Today:     27w 4d                                        EDD:   03/15/13
 Best:          26w 5d     Det. By:  LMP  (06/14/12)          EDD:   03/21/13
Anatomy
 Cranium:          Appears normal         Aortic Arch:      Previously seen
 Fetal Cavum:      Previously seen        Ductal Arch:      Previously seen
 Ventricles:       Previously seen        Diaphragm:        Previously seen
 Choroid Plexus:   Previously seen        Stomach:          Appears normal, left
                                                            sided
 Cerebellum:       Previously seen        Abdomen:          Appears normal
 Posterior Fossa:  Previously seen        Abdominal Wall:   Previously seen
 Nuchal Fold:      Previously seen        Cord Vessels:     Previously seen
 Face:             Orbits and profile     Kidneys:          Appear normal
                   previously seen
 Lips:             Previously seen        Bladder:          Appears normal
 Heart:            Previously seen        Spine:            Previously seen
 RVOT:             Previously seen        Lower             Previously seen
                                          Extremities:
 LVOT:             Previously seen        Upper             Previously seen
                                          Extremities:

 Other:  Fetus appears to be a male. Heels previously seen. Technically
         difficult due to maternal habitus and fetal position.
Comments

 Ms. Kishan returns for follow up growth due to A2 GDM.  She
 is currently on eveing NPH and reports that her blood sugars
 are well-controlled.
Impression

 Single IUP at 26 [DATE] weeks
 Normal interval anatomy- anatomic survey is now complete
 Interval growth is appropriate (63th %tile)
 Normal amniotic fluid volume
Recommendations

 Recommend follow-up ultrasound examination in 4 weeks for
 interval growth

 questions or concerns.

## 2015-02-04 ENCOUNTER — Emergency Department (HOSPITAL_BASED_OUTPATIENT_CLINIC_OR_DEPARTMENT_OTHER)
Admission: EM | Admit: 2015-02-04 | Discharge: 2015-02-04 | Disposition: A | Discharge status: Home / Self Care | Payer: Self-pay | Attending: Emergency Medicine | Admitting: Emergency Medicine

## 2015-02-04 ENCOUNTER — Encounter (HOSPITAL_BASED_OUTPATIENT_CLINIC_OR_DEPARTMENT_OTHER): Payer: Self-pay

## 2015-02-04 DIAGNOSIS — Z794 Long term (current) use of insulin: Secondary | ICD-10-CM | POA: Insufficient documentation

## 2015-02-04 DIAGNOSIS — J45909 Unspecified asthma, uncomplicated: Secondary | ICD-10-CM | POA: Insufficient documentation

## 2015-02-04 DIAGNOSIS — Z720 Tobacco use: Secondary | ICD-10-CM | POA: Insufficient documentation

## 2015-02-04 DIAGNOSIS — I10 Essential (primary) hypertension: Secondary | ICD-10-CM | POA: Insufficient documentation

## 2015-02-04 DIAGNOSIS — Z792 Long term (current) use of antibiotics: Secondary | ICD-10-CM | POA: Insufficient documentation

## 2015-02-04 DIAGNOSIS — L02411 Cutaneous abscess of right axilla: Secondary | ICD-10-CM

## 2015-02-04 DIAGNOSIS — Z79899 Other long term (current) drug therapy: Secondary | ICD-10-CM | POA: Insufficient documentation

## 2015-02-04 MED ORDER — SULFAMETHOXAZOLE-TRIMETHOPRIM 800-160 MG PO TABS: 1.0000 | ORAL_TABLET | Freq: Two times a day (BID) | ORAL | Status: AC

## 2015-02-04 MED ORDER — LIDOCAINE HCL (PF) 1 % IJ SOLN
5.0000 mL | Freq: Once | INTRAMUSCULAR | Status: AC
  Administered 2015-02-04: 5 mL

## 2015-02-04 MED ORDER — LIDOCAINE HCL (PF) 1 % IJ SOLN
INTRAMUSCULAR | Status: AC
  Filled 2015-02-04: qty 5

## 2015-02-04 NOTE — ED Notes (Signed)
MD at bedside. 

## 2015-02-04 NOTE — ED Provider Notes (Signed)
CSN: [id number]     Arrival date & time 02/04/15  0751 History   First MD Initiated Contact with Patient 02/04/15 [phone]     Chief Complaint  Patient presents with  . Abscess     (Consider location/radiation/quality/duration/timing/severity/associated sxs/prior Treatment) HPI Comments: Patient presents with an abscess in her right axillary area. She states that she felt that it started as a hair bump a few days ago but it's gradually gotten worse since that time. She denies any fevers. She denies any history of other abscesses. She states it's constantly throbbing and painful.  Patient is a 29 y.o. female presenting with abscess.  Abscess Associated symptoms: no fever, no headaches, no nausea and no vomiting     Past Medical History  Diagnosis Date  . Asthma   . Hypertension   . No pertinent past medical history   . Bacterial vaginal infection 03/19/2011   Past Surgical History  Procedure Laterality Date  . Breast reduction surgery      29 YRS OLD  . Cesarean section     No family history on file. History  Substance Use Topics  . Smoking status: Current Every Day Smoker -- 0.33 packs/day    Types: Cigarettes  . Smokeless tobacco: Not on file  . Alcohol Use: Yes     Comment: occ   OB History    Gravida Para Term Preterm AB TAB SAB Ectopic Multiple Living   2 0 0 0 1 0 1 0 0 0      Review of Systems  Constitutional: Negative for fever.  Gastrointestinal: Negative for nausea and vomiting.  Musculoskeletal: Negative for back pain, joint swelling, arthralgias and neck pain.  Skin: Positive for wound.  Neurological: Negative for weakness, numbness and headaches.      Allergies  Coconut and Mushroom extract complex  Home Medications   Prior to Admission medications   Medication Sig Start Date End Date Taking? Authorizing Provider  albuterol (PROVENTIL HFA;VENTOLIN HFA) 108 (90 BASE) MCG/ACT inhaler Inhale 1-2 puffs into the lungs every 6 (six) hours as needed for  wheezing. 04/17/13   Emilia Beck, PA-C  amoxicillin (AMOXIL) 500 MG capsule Take 1 capsule (500 mg total) by mouth 3 (three) times daily. 07/13/12   Cathren Laine, MD  HYDROcodone-acetaminophen (VICODIN) 5-500 MG per tablet Take 1-2 tablets by mouth every 6 (six) hours as needed for pain. 07/13/12   Cathren Laine, MD  ibuprofen (ADVIL,MOTRIN) 800 MG tablet Take 800 mg by mouth every 8 (eight) hours as needed. PATIENT TAKES FOR PAIN     Historical Provider, MD  insulin NPH (HUMULIN N,NOVOLIN N) 100 UNIT/ML injection Inject into the skin.    Historical Provider, MD  levofloxacin (LEVAQUIN) 500 MG tablet Take 1.5 tablets (750 mg total) by mouth daily. 04/17/13   Kaitlyn Szekalski, PA-C  sulfamethoxazole-trimethoprim (BACTRIM DS,SEPTRA DS) 800-160 MG per tablet Take 1 tablet by mouth 2 (two) times daily. 02/04/15 02/11/15  Rolan Bucco, MD   BP 147/95 mmHg  Pulse 84  Temp(Src) 98.5 F (36.9 C) (Oral)  Resp 16  Ht 5\' 3"  (1.6 m)  Wt 276 lb 2 oz (125.249 kg)  BMI 48.93 kg/m2  SpO2 96%  Breastfeeding? No Physical Exam  Constitutional: She is oriented to person, place, and time. She appears well-developed and well-nourished.  HENT:  Head: Normocephalic and atraumatic.  Neck: Normal range of motion. Neck supple.  Cardiovascular: Normal rate.   Pulmonary/Chest: Effort normal.  Musculoskeletal: She exhibits no edema or tenderness.  Neurological: She  is alert and oriented to person, place, and time.  Skin: Skin is warm and dry.  2 cm fluctuant abscess in the right axilla. There some clear drainage from the wound. There some mild surrounding erythema.  Psychiatric: She has a normal mood and affect.    ED Course  INCISION AND DRAINAGE Date/Time: 02/04/2015 8:32 AM Performed by: Calea Hribar Authorized by: Rolan Bucco Consent: Verbal consent obtained. Risks and benefits: risks, benefits and alternatives were discussed Consent given by: patient Type: abscess Body area: upper extremity  (right axilla) Anesthesia: local infiltration Local anesthetic: lidocaine 1% without epinephrine Anesthetic total: 2 ml Patient sedated: no Scalpel size: 11 Incision type: elliptical Complexity: simple Drainage: purulent Drainage amount: moderate Patient tolerance: Patient tolerated the procedure well with no immediate complications   (including critical care time) Labs Review Labs Reviewed - No data to display  Imaging Review No results found.   EKG Interpretation None      MDM   Final diagnoses:  Abscess of axilla, right    Patient started on Bactrim. She was advised to do warm compresses. She was advised to return if her symptoms worsen. Her tetanus shot is up-to-date.    Rolan Bucco, MD 02/04/15 [phone]

## 2015-02-04 NOTE — ED Notes (Signed)
MD at bedside for I&D of abscess.  I&D tray to bedside.

## 2015-02-04 NOTE — ED Notes (Signed)
Pt with draining R axillary abscess.

## 2015-02-04 NOTE — Discharge Instructions (Signed)

## 2015-02-14 ENCOUNTER — Emergency Department (HOSPITAL_COMMUNITY)
Admission: EM | Admit: 2015-02-14 | Discharge: 2015-02-14 | Disposition: A | Discharge status: Home / Self Care | Payer: Self-pay | Attending: Emergency Medicine | Admitting: Emergency Medicine

## 2015-02-14 ENCOUNTER — Encounter (HOSPITAL_COMMUNITY): Payer: Self-pay

## 2015-02-14 DIAGNOSIS — Z794 Long term (current) use of insulin: Secondary | ICD-10-CM | POA: Insufficient documentation

## 2015-02-14 DIAGNOSIS — J45909 Unspecified asthma, uncomplicated: Secondary | ICD-10-CM | POA: Insufficient documentation

## 2015-02-14 DIAGNOSIS — I10 Essential (primary) hypertension: Secondary | ICD-10-CM | POA: Insufficient documentation

## 2015-02-14 DIAGNOSIS — Z720 Tobacco use: Secondary | ICD-10-CM | POA: Insufficient documentation

## 2015-02-14 DIAGNOSIS — Z792 Long term (current) use of antibiotics: Secondary | ICD-10-CM | POA: Insufficient documentation

## 2015-02-14 DIAGNOSIS — Z79899 Other long term (current) drug therapy: Secondary | ICD-10-CM | POA: Insufficient documentation

## 2015-02-14 DIAGNOSIS — K0889 Other specified disorders of teeth and supporting structures: Secondary | ICD-10-CM

## 2015-02-14 DIAGNOSIS — Z8742 Personal history of other diseases of the female genital tract: Secondary | ICD-10-CM | POA: Insufficient documentation

## 2015-02-14 DIAGNOSIS — K029 Dental caries, unspecified: Secondary | ICD-10-CM | POA: Insufficient documentation

## 2015-02-14 MED ORDER — HYDROCODONE-ACETAMINOPHEN 5-325 MG PO TABS: 1.0000 | ORAL_TABLET | Freq: Four times a day (QID) | ORAL | Status: DC | PRN

## 2015-02-14 MED ORDER — NAPROXEN 500 MG PO TABS: 500.0000 mg | ORAL_TABLET | Freq: Two times a day (BID) | ORAL | Status: DC | PRN

## 2015-02-14 MED ORDER — DOXYCYCLINE HYCLATE 100 MG PO CAPS: 100.0000 mg | ORAL_CAPSULE | Freq: Two times a day (BID) | ORAL | Status: DC

## 2015-02-14 MED ORDER — NAPROXEN 500 MG PO TABS
500.0000 mg | ORAL_TABLET | Freq: Once | ORAL | Status: AC
  Administered 2015-02-14: 500 mg via ORAL
  Filled 2015-02-14: qty 1

## 2015-02-14 NOTE — ED Provider Notes (Signed)
CSN: [id number]     Arrival date & time 02/14/15  0808 History   First MD Initiated Contact with Patient 02/14/15 [phone]     Chief Complaint  Patient presents with  . Dental Pain     (Consider location/radiation/quality/duration/timing/severity/associated sxs/prior Treatment) HPI Comments: Kristine Gomez is a 29 y.o. female with a PMHx of asthma and HTN, who presents to the ED with complaints of 2 days of right upper and left upper molar pain. She states that her right upper molar broke on Saturday, and that she has had a known broken tooth in the left upper molar. The pain is 10/10 constant throbbing nonradiating worse with chewing and unrelieved with salt water and ibuprofen. She reports that she has some bleeding from her gums when she flosses. She denies any fevers, chills, drooling, trismus, rhinorrhea, ear pain or drainage, eye pain or drainage, sore throat, gingival swelling, purulent drainage, chest pain, shortness breath, abdominal pain, nausea, vomiting, diarrhea, constipation, dysuria, hematuria, vaginal bleeding or discharge, neck pain/swelling, facial swelling, numbness, tingling, or weakness. She is a smoker. She has no dentist.  Patient is a 29 y.o. female presenting with tooth pain. The history is provided by the patient. No language interpreter was used.  Dental Pain Location:  Upper Upper teeth location:  1/RU 3rd molar and 16/LU 3rd molar Quality:  Throbbing Severity:  Moderate Onset quality:  Gradual Duration:  2 days Timing:  Constant Progression:  Unchanged Chronicity:  Recurrent Context: dental fracture and poor dentition   Relieved by:  Nothing Worsened by:  Pressure Ineffective treatments:  NSAIDs Associated symptoms: oral bleeding (with flossing)   Associated symptoms: no difficulty swallowing, no drooling, no facial pain, no facial swelling, no fever, no gum swelling, no neck pain, no neck swelling, no oral lesions and no trismus   Risk factors: lack of dental care  and smoking     Past Medical History  Diagnosis Date  . Asthma   . Hypertension   . No pertinent past medical history   . Bacterial vaginal infection 03/19/2011   Past Surgical History  Procedure Laterality Date  . Breast reduction surgery      29 YRS OLD  . Cesarean section     No family history on file. History  Substance Use Topics  . Smoking status: Current Every Day Smoker -- 0.33 packs/day    Types: Cigarettes  . Smokeless tobacco: Not on file  . Alcohol Use: Yes     Comment: occ   OB History    Gravida Para Term Preterm AB TAB SAB Ectopic Multiple Living       Review of Systems  Constitutional: Negative for fever and chills.  HENT: Positive for dental problem. Negative for drooling, ear discharge, ear pain, facial swelling, mouth sores, rhinorrhea, sore throat and trouble swallowing.   Eyes: Negative for discharge and itching.  Respiratory: Negative for shortness of breath.   Cardiovascular: Negative for chest pain.  Gastrointestinal: Negative for nausea, vomiting, abdominal pain, diarrhea and constipation.  Genitourinary: Negative for dysuria, hematuria, vaginal bleeding and vaginal discharge.  Musculoskeletal: Negative for myalgias, arthralgias and neck pain.  Skin: Negative for color change.  Allergic/Immunologic: Negative for immunocompromised state.  Neurological: Negative for weakness and numbness.  Psychiatric/Behavioral: Negative for confusion.   10 Systems reviewed and are negative for acute change except as noted in the HPI.    Allergies  Coconut and Mushroom extract complex  Home Medications  Prior to Admission medications   Medication Sig Start Date End Date Taking? Authorizing Provider  albuterol (PROVENTIL HFA;VENTOLIN HFA) 108 (90 BASE) MCG/ACT inhaler Inhale 1-2 puffs into the lungs every 6 (six) hours as needed for wheezing. 04/17/13   Emilia Beck, PA-C  amoxicillin (AMOXIL) 500 MG capsule Take 1 capsule (500 mg  total) by mouth 3 (three) times daily. 07/13/12   Cathren Laine, MD  HYDROcodone-acetaminophen (VICODIN) 5-500 MG per tablet Take 1-2 tablets by mouth every 6 (six) hours as needed for pain. 07/13/12   Cathren Laine, MD  ibuprofen (ADVIL,MOTRIN) 800 MG tablet Take 800 mg by mouth every 8 (eight) hours as needed. PATIENT TAKES FOR PAIN     Historical Provider, MD  insulin NPH (HUMULIN N,NOVOLIN N) 100 UNIT/ML injection Inject into the skin.    Historical Provider, MD  levofloxacin (LEVAQUIN) 500 MG tablet Take 1.5 tablets (750 mg total) by mouth daily. 04/17/13   Kaitlyn Szekalski, PA-C   BP 146/106 mmHg  Pulse 88  Temp(Src) 98.6 F (37 C) (Oral)  Resp 17  SpO2 98% Physical Exam  Constitutional: She is oriented to person, place, and time. Vital signs are normal. She appears well-developed and well-nourished.  Non-toxic appearance. No distress.  Afebrile, nontoxic, NAD. Mildly hypertensive  HENT:  Head: Normocephalic and atraumatic.  Nose: Nose normal.  Mouth/Throat: Uvula is midline, oropharynx is clear and moist and mucous membranes are normal. No oral lesions. No trismus in the jaw. Abnormal dentition (decayed). Dental caries present. No dental abscesses or uvula swelling.    Teeth #1 and 16 decayed and with mild TTP, no gingival swelling or erythema, no dental abscess, no oral lesions. Oropharynx clear and moist, without uvular swelling or deviation, no trismus or drooling, no tonsillar swelling or erythema, no exudates. Nose clear.   Eyes: Conjunctivae and EOM are normal. Right eye exhibits no discharge. Left eye exhibits no discharge.  Neck: Normal range of motion. Neck supple.  Cardiovascular: Normal rate.   Pulmonary/Chest: Effort normal. No respiratory distress.  Abdominal: Normal appearance. She exhibits no distension.  Musculoskeletal: Normal range of motion.  Neurological: She is alert and oriented to person, place, and time. She has normal strength. No sensory deficit.  Skin:  Skin is warm, dry and intact. No rash noted.  Psychiatric: She has a normal mood and affect. Her behavior is normal.  Nursing note and vitals reviewed.   ED Course  Procedures (including critical care time) Labs Review Labs Reviewed - No data to display  Imaging Review No results found.   EKG Interpretation None      MDM   Final diagnoses:  Dental decay  Pain, dental    29 y.o. female here with dental pain related to dental decay and possible early infection with patient afebrile, non toxic appearing and swallowing secretions well. I gave patient referral to dentist and stressed the importance of dental follow up for ultimate management of dental pain.  Smoking cessation discussed. I have also discussed reasons to return immediately to the ER.  Patient expresses understanding and agrees with plan.  I will also give doxycycline and pain control.  Pt unable to get narcotics here due to needing to go to work, but will give script for few tabs.  BP 146/106 mmHg  Pulse 88  Temp(Src) 98.6 F (37 C) (Oral)  Resp 17  SpO2 98%  Meds ordered this encounter  Medications  . naproxen (NAPROSYN) tablet 500 mg    Sig:   . naproxen (NAPROSYN)  500 MG tablet    Sig: Take 1 tablet (500 mg total) by mouth 2 (two) times daily as needed for mild pain, moderate pain or headache (TAKE WITH MEALS.).    Dispense:  20 tablet    Refill:  0    Order Specific Question:  Supervising Provider    Answer:  MILLER, BRIAN [[id number]]  . HYDROcodone-acetaminophen (NORCO) 5-325 MG per tablet    Sig: Take 1 tablet by mouth every 6 (six) hours as needed for severe pain.    Dispense:  10 tablet    Refill:  0    Order Specific Question:  Supervising Provider    Answer:  Hyacinth Meeker, BRIAN [[id number]]  . doxycycline (VIBRAMYCIN) 100 MG capsule    Sig: Take 1 capsule (100 mg total) by mouth 2 (two) times daily. One po bid x 7 days    Dispense:  14 capsule    Refill:  0    Order Specific Question:  Supervising Provider     Answer:  Eber Hong [[id number]]       Darey Hershberger Camprubi-Soms, PA-C 02/14/15 [id number]  Purvis Sheffield, MD 02/15/15 [id number]

## 2015-02-14 NOTE — ED Notes (Signed)
Pt c/o bilateral upper dental pain x "a while."  Pain score 10/10.  Pt reports taking ibuprofen w/ "a little" relief.  Pt reports R upper tooth "broke off" x 2 days ago.

## 2015-02-14 NOTE — Discharge Instructions (Signed)
Apply warm compresses to jaw throughout the day. Take antibiotic until finished. Take naprosyn and norco as directed, as needed for pain but do not drive or operate machinery with pain medication use. Followup with a dentist is very important for ongoing evaluation and management of recurrent dental pain. Call the dentist listed above, or use the list below to find a dentist. STOP SMOKING. Return to emergency department for emergent changing or worsening symptoms.    Dental Caries Dental caries (also called tooth decay) is the most common oral disease. It can occur at any age but is more common in children and young adults.  HOW DENTAL CARIES DEVELOPS  The process of decay begins when bacteria and foods (particularly sugars and starches) combine in your mouth to produce plaque. Plaque is a substance that sticks to the hard, outer surface of a tooth (enamel). The bacteria in plaque produce acids that attack enamel. These acids may also attack the root surface of a tooth (cementum) if it is exposed. Repeated attacks dissolve these surfaces and create holes in the tooth (cavities). If left untreated, the acids destroy the other layers of the tooth.  RISK FACTORS  Frequent sipping of sugary beverages.   Frequent snacking on sugary and starchy foods, especially those that easily get stuck in the teeth.   Poor oral hygiene.   Dry mouth.   Substance abuse such as methamphetamine abuse.   Broken or poor-fitting dental restorations.   Eating disorders.   Gastroesophageal reflux disease (GERD).   Certain radiation treatments to the head and neck. SYMPTOMS In the early stages of dental caries, symptoms are seldom present. Sometimes white, chalky areas may be seen on the enamel or other tooth layers. In later stages, symptoms may include:  Pits and holes on the enamel.  Toothache after sweet, hot, or cold foods or drinks are consumed.  Pain around the tooth.  Swelling around the  tooth. DIAGNOSIS  Most of the time, dental caries is detected during a regular dental checkup. A diagnosis is made after a thorough medical and dental history is taken and the surfaces of your teeth are checked for signs of dental caries. Sometimes special instruments, such as lasers, are used to check for dental caries. Dental X-ray exams may be taken so that areas not visible to the eye (such as between the contact areas of the teeth) can be checked for cavities.  TREATMENT  If dental caries is in its early stages, it may be reversed with a fluoride treatment or an application of a remineralizing agent at the dental office. Thorough brushing and flossing at home is needed to aid these treatments. If it is in its later stages, treatment depends on the location and extent of tooth destruction:   If a small area of the tooth has been destroyed, the destroyed area will be removed and cavities will be filled with a material such as gold, silver amalgam, or composite resin.   If a large area of the tooth has been destroyed, the destroyed area will be removed and a cap (crown) will be fitted over the remaining tooth structure.   If the center part of the tooth (pulp) is affected, a procedure called a root canal will be needed before a filling or crown can be placed.   If most of the tooth has been destroyed, the tooth may need to be pulled (extracted). HOME CARE INSTRUCTIONS You can prevent, stop, or reverse dental caries at home by practicing good oral hygiene.  Good oral hygiene includes:  Thoroughly cleaning your teeth at least twice a day with a toothbrush and dental floss.   Using a fluoride toothpaste. A fluoride mouth rinse may also be used if recommended by your dentist or health care provider.   Restricting the amount of sugary and starchy foods and sugary liquids you consume.   Avoiding frequent snacking on these foods and sipping of these liquids.   Keeping regular visits with a  dentist for checkups and cleanings. PREVENTION   Practice good oral hygiene.  Consider a dental sealant. A dental sealant is a coating material that is applied by your dentist to the pits and grooves of teeth. The sealant prevents food from being trapped in them. It may protect the teeth for several years.  Ask about fluoride supplements if you live in a community without fluorinated water or with water that has a low fluoride content. Use fluoride supplements as directed by your dentist or health care provider.  Allow fluoride varnish applications to teeth if directed by your dentist or health care provider. Document Released: 05/05/2002 Document Revised: 12/28/2013 Document Reviewed: 08/15/2012 [facility] Patient Information 2015 [place], [place]. This information is not intended to replace advice given to you by your health care provider. Make sure you discuss any questions you have with your health care provider.  Dental Pain A tooth ache may be caused by cavities (tooth decay). Cavities expose the nerve of the tooth to air and hot or cold temperatures. It may come from an infection or abscess (also called a boil or furuncle) around your tooth. It is also often caused by dental caries (tooth decay). This causes the pain you are having. DIAGNOSIS  Your caregiver can diagnose this problem by exam. TREATMENT   If caused by an infection, it may be treated with medications which kill germs (antibiotics) and pain medications as prescribed by your caregiver. Take medications as directed.  Only take over-the-counter or prescription medicines for pain, discomfort, or fever as directed by your caregiver.  Whether the tooth ache today is caused by infection or dental disease, you should see your dentist as soon as possible for further care. SEEK MEDICAL CARE IF: The exam and treatment you received today has been provided on an emergency basis only. This is not a substitute for complete medical or  dental care. If your problem worsens or new problems (symptoms) appear, and you are unable to meet with your dentist, call or return to this location. SEEK IMMEDIATE MEDICAL CARE IF:   You have a fever.  You develop redness and swelling of your face, jaw, or neck.  You are unable to open your mouth.  You have severe pain uncontrolled by pain medicine. MAKE SURE YOU:   Understand these instructions.  Will watch your condition.  Will get help right away if you are not doing well or get worse. Document Released: 08/13/2005 Document Revised: 11/05/2011 Document Reviewed: 03/31/2008 [facility] Patient Information 2015 [place], [place]. This information is not intended to replace advice given to you by your health care provider. Make sure you discuss any questions you have with your health care provider.  Dental Care and Dentist Visits Dental care supports good overall health. Regular dental visits can also help you avoid dental pain, bleeding, infection, and other more serious health problems in the future. It is important to keep the mouth healthy because diseases in the teeth, gums, and other oral tissues can spread to other areas of the body. Some problems, such  as diabetes, heart disease, and pre-term labor have been associated with poor oral health.  See your dentist every 6 months. If you experience emergency problems such as a toothache or broken tooth, go to the dentist right away. If you see your dentist regularly, you may catch problems early. It is easier to be treated for problems in the early stages.  WHAT TO EXPECT AT A DENTIST VISIT  Your dentist will look for many common oral health problems and recommend proper treatment. At your regular dental visit, you can expect:  Gentle cleaning of the teeth and gums. This includes scraping and polishing. This helps to remove the sticky substance around the teeth and gums (plaque). Plaque forms in the mouth shortly after eating. Over time,  plaque hardens on the teeth as tartar. If tartar is not removed regularly, it can cause problems. Cleaning also helps remove stains.  Periodic X-rays. These pictures of the teeth and supporting bone will help your dentist assess the health of your teeth.  Periodic fluoride treatments. Fluoride is a natural mineral shown to help strengthen teeth. Fluoride treatmentinvolves applying a fluoride gel or varnish to the teeth. It is most commonly done in children.  Examination of the mouth, tongue, jaws, teeth, and gums to look for any oral health problems, such as:  Cavities (dental caries). This is decay on the tooth caused by plaque, sugar, and acid in the mouth. It is best to catch a cavity when it is small.  Inflammation of the gums caused by plaque buildup (gingivitis).  Problems with the mouth or malformed or misaligned teeth.  Oral cancer or other diseases of the soft tissues or jaws. KEEP YOUR TEETH AND GUMS HEALTHY For healthy teeth and gums, follow these general guidelines as well as your dentist's specific advice:  Have your teeth professionally cleaned at the dentist every 6 months.  Brush twice daily with a fluoride toothpaste.  Floss your teeth daily.  Ask your dentist if you need fluoride supplements, treatments, or fluoride toothpaste.  Eat a healthy diet. Reduce foods and drinks with added sugar.  Avoid smoking. TREATMENT FOR ORAL HEALTH PROBLEMS If you have oral health problems, treatment varies depending on the conditions present in your teeth and gums.  Your caregiver will most likely recommend good oral hygiene at each visit.  For cavities, gingivitis, or other oral health disease, your caregiver will perform a procedure to treat the problem. This is typically done at a separate appointment. Sometimes your caregiver will refer you to another dental specialist for specific tooth problems or for surgery. SEEK IMMEDIATE DENTAL CARE IF:  You have pain, bleeding, or  soreness in the gum, tooth, jaw, or mouth area.  A permanent tooth becomes loose or separated from the gum socket.  You experience a blow or injury to the mouth or jaw area. Document Released: 04/25/2011 Document Revised: 11/05/2011 Document Reviewed: 04/25/2011 [facility] Patient Information 2015 [place], [place]. This information is not intended to replace advice given to you by your health care provider. Make sure you discuss any questions you have with your health care provider.  Emergency Department Resource Guide 1) Find a Doctor and Pay Out of Pocket Although you won't have to find out who is covered by your insurance plan, it is a good idea to ask around and get recommendations. You will then need to call the office and see if the doctor you have chosen will accept you as a new patient and what types of options they offer  for patients who are self-pay. Some doctors offer discounts or will set up payment plans for their patients who do not have insurance, but you will need to ask so you aren't surprised when you get to your appointment.  2) Contact Your Local Health Department Not all health departments have doctors that can see patients for sick visits, but many do, so it is worth a call to see if yours does. If you don't know where your local health department is, you can check in your phone book. The CDC also has a tool to help you locate your state's health department, and many state websites also have listings of all of their local health departments.  3) Find a Walk-in Clinic If your illness is not likely to be very severe or complicated, you may want to try a walk in clinic. These are popping up all over the country in pharmacies, drugstores, and shopping centers. They're usually staffed by nurse practitioners or physician assistants that have been trained to treat common illnesses and complaints. They're usually fairly quick and inexpensive. However, if you have serious medical issues or  chronic medical problems, these are probably not your best option.  No Primary Care Doctor: - Call Health Connect at  [phone] - they can help you locate a primary care doctor that  accepts your insurance, provides certain services, etc. - Physician Referral Service- [phone]  Chronic Pain Problems: Organization         Address  Phone   Notes  [facility]  [phone] Patients need to be referred by their primary care doctor.   Medication Assistance: Organization         Address  Phone   Notes  [facility] Medication [facility] [address] [place] [place], [place] [address] [phone] --Must be a resident of [place] -- Must have NO insurance coverage whatsoever (no Medicaid/ Medicare, etc.) -- The pt. MUST have a primary care doctor that directs their care regularly and follows them in the community   [facility]  [phone]   [place]  [phone]     Dental Care: Organization         Address  Phone  Notes  [facility] Department of [facility] [address] [place], [place] [phone] Accepts children up to age 34 who are enrolled in [place] or [place] Health Choice; pregnant women with a Medicaid card; and children who have applied for Medicaid or [place] Health Choice, but were declined, whose parents can pay a reduced fee at time of service.  [facility] Department of [facility]  [address], [place] [phone] Accepts children up to age 66 who are enrolled in [place] or [place] Health Choice; pregnant women with a Medicaid card; and children who have applied for Medicaid or [place] Health Choice, but were declined, whose parents can pay a reduced fee at time of service.  [facility]  [address] [place], [place] [phone] Patients are seen by appointment only. Walk-ins are not accepted. [facility] will see patients 74 years of age  and older. Monday - Tuesday (8am-5pm) Most Wednesdays (8:30-5pm) $30 per visit, cash only  [facility]  [address], [facility] [phone] Patients are seen by appointment only. Walk-ins are not accepted. [facility] will see patients 38 years of age and older. One Wednesday Evening (Monthly: Volunteer  Based).  $30 per visit, cash only  [facility]  [phone] for adults; Children under age 79, call Graduate Pediatric Dentistry at [phone]. Children aged 60-14, please call [phone] to request a pediatric application.  Dental services are provided in all areas of dental care including fillings, crowns and bridges, complete and partial dentures, implants, gum treatment, root canals, and extractions. Preventive care is also provided. Treatment is provided to both adults and children. Patients are selected via a lottery and there is often a waiting list.   [facility] [address], [place]  [phone] [email or web address]   [facility] [address] [place], [place] [phone], Ext. [phone] Second and Fourth Thursday of each month, opens at 6:30 AM; Clinic ends at 9 AM.  Patients are seen on a first-come first-served basis, and a limited number are seen during each clinic.   [facility]  [address] [place], [place] [phone]   Eligibility Requirements You must have lived in [place], [place], or [place] counties for at least the last three months.   You cannot be eligible for state or federal sponsored [place], including [employer], [place], or [employer].   You generally cannot be eligible for healthcare insurance through your employer.    How to apply: Eligibility screenings are held every Tuesday and Wednesday afternoon from 1:00 pm until 4:00 pm. You do not need an appointment for the interview!  [facility] [address], [place], [place] [phone]   [facility] Health Department  [phone]   [facility] Health Department  [phone]   [facility] Health Department  [phone]

## 2015-02-14 NOTE — ED Notes (Signed)
Bed: WTR6 Expected date:  Expected time:  Means of arrival:  Comments: 

## 2016-09-17 ENCOUNTER — Encounter (HOSPITAL_BASED_OUTPATIENT_CLINIC_OR_DEPARTMENT_OTHER): Payer: Self-pay | Admitting: *Deleted

## 2016-09-17 DIAGNOSIS — J45909 Unspecified asthma, uncomplicated: Secondary | ICD-10-CM | POA: Insufficient documentation

## 2016-09-17 DIAGNOSIS — I10 Essential (primary) hypertension: Secondary | ICD-10-CM | POA: Insufficient documentation

## 2016-09-17 DIAGNOSIS — K029 Dental caries, unspecified: Secondary | ICD-10-CM | POA: Insufficient documentation

## 2016-09-17 DIAGNOSIS — Z79899 Other long term (current) drug therapy: Secondary | ICD-10-CM | POA: Insufficient documentation

## 2016-09-17 DIAGNOSIS — F17210 Nicotine dependence, cigarettes, uncomplicated: Secondary | ICD-10-CM | POA: Insufficient documentation

## 2016-09-17 NOTE — ED Triage Notes (Signed)
Pt c/o dental pain x 2 days.  

## 2016-09-18 ENCOUNTER — Emergency Department (HOSPITAL_BASED_OUTPATIENT_CLINIC_OR_DEPARTMENT_OTHER)
Admission: EM | Admit: 2016-09-18 | Discharge: 2016-09-18 | Disposition: A | Discharge status: Home / Self Care | Payer: Self-pay | Attending: Emergency Medicine | Admitting: Emergency Medicine

## 2016-09-18 DIAGNOSIS — K029 Dental caries, unspecified: Secondary | ICD-10-CM

## 2016-09-18 MED ORDER — PENICILLIN V POTASSIUM 250 MG PO TABS
500.0000 mg | ORAL_TABLET | Freq: Once | ORAL | Status: AC
  Administered 2016-09-18: 500 mg via ORAL
  Filled 2016-09-18: qty 2

## 2016-09-18 MED ORDER — PENICILLIN V POTASSIUM 500 MG PO TABS: 500.0000 mg | ORAL_TABLET | Freq: Four times a day (QID) | ORAL | 0 refills | Status: DC

## 2016-09-18 MED ORDER — ONDANSETRON 4 MG PO TBDP
4.0000 mg | ORAL_TABLET | Freq: Once | ORAL | Status: AC
  Administered 2016-09-18: 4 mg via ORAL
  Filled 2016-09-18: qty 1

## 2016-09-18 MED ORDER — IBUPROFEN 800 MG PO TABS: 800.0000 mg | ORAL_TABLET | Freq: Three times a day (TID) | ORAL | 0 refills | Status: DC | PRN

## 2016-09-18 MED ORDER — IBUPROFEN 800 MG PO TABS
ORAL_TABLET | ORAL | Status: AC
  Filled 2016-09-18: qty 1

## 2016-09-18 MED ORDER — IBUPROFEN 800 MG PO TABS
800.0000 mg | ORAL_TABLET | Freq: Once | ORAL | Status: AC
  Administered 2016-09-18: 800 mg via ORAL

## 2016-09-18 NOTE — ED Provider Notes (Signed)
By signing my name below, I, Kristine Gomez, attest that this documentation has been prepared under the direction and in the presence of Kristine Christiansen N Elisha Cooksey, DO. Electronically signed, Kristine Gomez, ED Scribe. 09/18/16. 2:40 AM.  TIME SEEN: 2:13 AM  CHIEF COMPLAINT: Dental pain  HPI:  HPI Comments: Kristine Gomez is a 31 y.o. female with history of hypertension and asthma who presents to the Emergency Department complaining of gradual onset right upper dental pain that started two days ago. Pt states that she may have broke her tooth at work a few days ago and believes it may have become infected. At work today pt noticed a metallic taste in her mouth and has been nauseas intermittently today since the onset of her symptoms. Pt did receive Ibuprofen 800mg  on arrival to the ED. She does not have a current dentist to follow up with.  ROS: See HPI Constitutional: no fever  Eyes: no drainage  ENT: no runny nose   Cardiovascular:  no chest pain  Resp: no SOB  GI: no vomiting GU: no dysuria Integumentary: no rash  Allergy: no hives  Musculoskeletal: no leg swelling  Neurological: no slurred speech ROS otherwise negative  PAST MEDICAL HISTORY/PAST SURGICAL HISTORY:  Past Medical History:  Diagnosis Date  . Asthma   . Bacterial vaginal infection 03/19/2011  . Hypertension   . No pertinent past medical history     MEDICATIONS:  Prior to Admission medications   Medication Sig Start Date End Date Taking? Authorizing Provider  albuterol (PROVENTIL HFA;VENTOLIN HFA) 108 (90 BASE) MCG/ACT inhaler Inhale 1-2 puffs into the lungs every 6 (six) hours as needed for wheezing. 04/17/13   Emilia BeckKaitlyn Szekalski, PA-C  doxycycline (VIBRAMYCIN) 100 MG capsule Take 1 capsule (100 mg total) by mouth 2 (two) times daily. One po bid x 7 days 02/14/15   [facility] PsMercedes [address], PA-C  ibuprofen (ADVIL,MOTRIN) 800 MG tablet Take 800 mg by mouth every 8 (eight) hours as needed. PATIENT TAKES FOR PAIN     Historical Provider, MD   insulin NPH (HUMULIN N,NOVOLIN N) 100 UNIT/ML injection Inject into the skin.    Historical Provider, MD  levofloxacin (LEVAQUIN) 500 MG tablet Take 1.5 tablets (750 mg total) by mouth daily. 04/17/13   Kaitlyn Szekalski, PA-C  naproxen (NAPROSYN) 500 MG tablet Take 1 tablet (500 mg total) by mouth 2 (two) times daily as needed for mild pain, moderate pain or headache (TAKE WITH MEALS.). 02/14/15   [address], PA-C    ALLERGIES:  Allergies  Allergen Reactions  . Coconut [Nuts] Hives  . Mushroom Extract Complex Hives    SOCIAL HISTORY:  Social History  Substance Use Topics  . Smoking status: Current Every Day Smoker    Packs/day: 0.33    Types: Cigarettes  . Smokeless tobacco: Not on file  . Alcohol use Yes     Comment: occ    FAMILY HISTORY: No family history on file.  EXAM: BP 151/96   Pulse 105   Temp 98.5 F (36.9 C)   Resp 18   Ht 5\' 3"  (1.6 m)   Wt 273 lb (123.8 kg)   SpO2 100%   BMI 48.36 kg/m  CONSTITUTIONAL: Alert and oriented and responds appropriately to questions. Well-appearing; well-nourished, Afebrile, nontoxic-appearing; obese HEAD: Normocephalic EYES: Conjunctivae clear, PERRL, EOMI ENT: normal nose; no rhinorrhea; moist mucous membranes; No pharyngeal erythema or petechiae, no tonsillar hypertrophy or exudate, no uvular deviation, no unilateral swelling, no trismus or drooling, no muffled voice, normal phonation,  no stridor, multiple dental caries present, tender over the right upper canine, no drainable dental abscess noted, no Ludwig's angina, tongue sits flat in the bottom of the mouth, no angioedema, no facial erythema or warmth, no facial swelling; no pain with movement of the neck NECK: Supple, no meningismus, no nuchal rigidity, no LAD  CARD: RRR; S1 and S2 appreciated; no murmurs, no clicks, no rubs, no gallops RESP: Normal chest excursion without splinting or tachypnea; breath sounds clear and equal bilaterally; no wheezes, no rhonchi,  no rales, no hypoxia or respiratory distress, speaking full sentences ABD/GI: Normal bowel sounds; non-distended; soft, non-tender, no rebound, no guarding, no peritoneal signs, no hepatosplenomegaly BACK:  The back appears normal and is non-tender to palpation, there is no CVA tenderness EXT: Normal ROM in all joints; non-tender to palpation; no edema; normal capillary refill; no cyanosis, no calf tenderness or swelling    SKIN: Normal color for age and race; warm; no rash NEURO: Moves all extremities equally, sensation to light touch intact diffusely, cranial nerves II through XII intact, normal speech PSYCH: The patient's mood and manner are appropriate. Grooming and personal hygiene are appropriate.  MEDICAL DECISION MAKING: Patient here with dental pain likely due to periapical abscess, dental decay. No drainable abscess on exam. No Ludwig's angina. No facial cellulitis. Given ibuprofen in the waiting room. Discharge on penicillin and have recommended alternating Tylenol and Motrin for pain. We'll give her outpatient dental follow-up. Discussed return precautions. She is comfortable with this plan.   At this time, I do not feel there is any life-threatening condition present. I have reviewed and discussed all results (EKG, imaging, lab, urine as appropriate) and exam findings with patient/family. I have reviewed nursing notes and appropriate previous records.  I feel the patient is safe to be discharged home without further emergent workup and can continue workup as an outpatient as needed. Discussed usual and customary return precautions. Patient/family verbalize understanding and are comfortable with this plan.  Outpatient follow-up has been provided. All questions have been answered.   I personally performed the services described in this documentation, which was scribed in my presence. The recorded information has been reviewed and is accurate.     Kristine Maw Jomayra Novitsky, DO 09/18/16 [phone]

## 2016-09-18 NOTE — ED Notes (Signed)
Pt verbalizes understanding of d/c instructions and denies any further needs at this time. 

## 2017-06-05 ENCOUNTER — Encounter (HOSPITAL_BASED_OUTPATIENT_CLINIC_OR_DEPARTMENT_OTHER): Payer: Self-pay

## 2017-06-05 ENCOUNTER — Emergency Department (HOSPITAL_BASED_OUTPATIENT_CLINIC_OR_DEPARTMENT_OTHER)
Admission: EM | Admit: 2017-06-05 | Discharge: 2017-06-05 | Disposition: A | Discharge status: Home / Self Care | Payer: BLUE CROSS/BLUE SHIELD | Attending: Emergency Medicine | Admitting: Emergency Medicine

## 2017-06-05 DIAGNOSIS — I10 Essential (primary) hypertension: Secondary | ICD-10-CM | POA: Insufficient documentation

## 2017-06-05 DIAGNOSIS — H6692 Otitis media, unspecified, left ear: Secondary | ICD-10-CM | POA: Diagnosis not present

## 2017-06-05 DIAGNOSIS — F17210 Nicotine dependence, cigarettes, uncomplicated: Secondary | ICD-10-CM | POA: Diagnosis not present

## 2017-06-05 DIAGNOSIS — J4521 Mild intermittent asthma with (acute) exacerbation: Secondary | ICD-10-CM | POA: Diagnosis not present

## 2017-06-05 DIAGNOSIS — H7292 Unspecified perforation of tympanic membrane, left ear: Secondary | ICD-10-CM | POA: Insufficient documentation

## 2017-06-05 DIAGNOSIS — K047 Periapical abscess without sinus: Secondary | ICD-10-CM | POA: Diagnosis not present

## 2017-06-05 DIAGNOSIS — K0889 Other specified disorders of teeth and supporting structures: Secondary | ICD-10-CM | POA: Diagnosis present

## 2017-06-05 MED ORDER — LORATADINE 10 MG PO TABS: 10.0000 mg | ORAL_TABLET | Freq: Every day | ORAL | 0 refills | Status: AC

## 2017-06-05 MED ORDER — ALBUTEROL SULFATE (2.5 MG/3ML) 0.083% IN NEBU
5.0000 mg | INHALATION_SOLUTION | Freq: Once | RESPIRATORY_TRACT | Status: AC
  Administered 2017-06-05: 5 mg via RESPIRATORY_TRACT
  Filled 2017-06-05: qty 6

## 2017-06-05 MED ORDER — AMOXICILLIN-POT CLAVULANATE 875-125 MG PO TABS: 1.0000 | ORAL_TABLET | Freq: Two times a day (BID) | ORAL | 0 refills | Status: AC

## 2017-06-05 MED ORDER — PREDNISONE 20 MG PO TABS: ORAL_TABLET | ORAL | 0 refills | Status: AC

## 2017-06-05 MED ORDER — FLUTICASONE PROPIONATE 50 MCG/ACT NA SUSP: 2.0000 | Freq: Every day | NASAL | 0 refills | Status: AC

## 2017-06-05 MED ORDER — PREDNISONE 50 MG PO TABS
60.0000 mg | ORAL_TABLET | Freq: Once | ORAL | Status: AC
  Administered 2017-06-05: 14:00:00 60 mg via ORAL
  Filled 2017-06-05: qty 1

## 2017-06-05 MED FILL — FLUTICASONE PROP 50 MCG SPR: 50 | 30 days supply | Qty: 16 | Fill #0

## 2017-06-05 MED FILL — AMOX-CLAV 875-125 MG TABLET: 875-125 | 7 days supply | Qty: 14 | Fill #0

## 2017-06-05 MED FILL — predniSONE 20 MG TABS: 20 | 4 days supply | Qty: 8 | Fill #0

## 2017-06-05 NOTE — ED Notes (Signed)
ED Provider at bedside. 

## 2017-06-05 NOTE — ED Provider Notes (Signed)
[facility]-EMERGENCY DEPT Provider Note   CSN: [id number] Arrival date & time: 06/05/17  1235     History   Chief Complaint Chief Complaint  Patient presents with  . Dental Pain    HPI Kristine Gomez is a 31 y.o. female.  HPI Patient presents with one week of left lower molar pain. States she has known dental disease. She has yet to see a dentist. She developed left-sided ear pain 2 days ago. Here pain has improved. Coworker noticed that she had blood coming from her left ear. No known trauma. States she does not use Q-tips. No fever or chills. Patient also admits to congestion and sinus pressure. 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 She's been using an inhaler with some relief. Past Medical History:  Diagnosis Date  . Asthma   . Bacterial vaginal infection 03/19/2011  . Hypertension   . No pertinent past medical history     Patient Active Problem List   Diagnosis Date Noted  . Bacterial vaginal infection 03/19/2011    Past Surgical History:  Procedure Laterality Date  . BREAST REDUCTION SURGERY     31 YRS OLD  . CESAREAN SECTION      OB History    Gravida Para Term Preterm AB Living   2 0 0 0 1 0   SAB TAB Ectopic Multiple Live Births   1 0 0 0         Home Medications    Prior to Admission medications   Medication Sig Start Date End Date Taking? Authorizing Provider  albuterol (PROVENTIL HFA;VENTOLIN HFA) 108 (90 BASE) MCG/ACT inhaler Inhale 1-2 puffs into the lungs every 6 (six) hours as needed for wheezing. 04/17/13   Emilia Beck, PA-C  amoxicillin-clavulanate (AUGMENTIN) 875-125 MG tablet Take 1 tablet by mouth 2 (two) times daily. One po bid x 7 days 06/05/17   Loren Racer, MD  fluticasone [facility]) 50 MCG/ACT nasal spray Place 2 sprays into both nostrils daily. 06/05/17   Loren Racer, MD  loratadine (CLARITIN) 10 MG tablet Take 1 tablet (10 mg total) by mouth daily. 06/05/17   Loren Racer, MD    predniSONE (DELTASONE) 20 MG tablet 2 tabs po daily x 4 days 06/06/17   Loren Racer, MD    Family History No family history on file.  Social History Social History  Substance Use Topics  . Smoking status: Current Every Day Smoker    Packs/day: 0.33    Types: Cigarettes  . Smokeless tobacco: Never Used  . Alcohol use Yes     Comment: occ     Allergies   Coconut [nuts] and Mushroom extract complex   Review of Systems Review of Systems  Constitutional: Negative for chills and fever.  HENT: Positive for congestion, ear discharge, ear pain, sinus pain and sinus pressure. Negative for hearing loss and sore throat.   Respiratory: Positive for cough, shortness of breath and wheezing.   Cardiovascular: Negative for chest pain, palpitations and leg swelling.  Gastrointestinal: Negative for abdominal pain, constipation, diarrhea, nausea and vomiting.  Genitourinary: Negative for dysuria, flank pain and frequency.  Musculoskeletal: Negative for back pain, myalgias, neck pain and neck stiffness.  Skin: Negative for wound.  Neurological: Negative for dizziness, weakness, light-headedness, numbness and headaches.  All other systems reviewed and are negative.    Physical Exam Updated Vital Signs BP (!) 152/102 (BP Location: Left Arm)   Pulse (!) 108   Temp 98.9 F (37.2 C) (  Oral)   Resp 18   Ht  (1.6 m)   Wt 116.1 kg (256 lb)   SpO2 98%   BMI 45.35 kg/m   Physical Exam  Constitutional: She is oriented to person, place, and time. She appears well-developed and well-nourished. No distress.  HENT:  Head: Normocephalic and atraumatic.  Mouth/Throat: Oropharynx is clear and moist.  Bilateral nasal mucosal edema. Diffuse sinus tenderness to percussion. Patient has tenderness to palpation over the left second mandibular molar. 2 lesions present. No gingival fluctuance. Left TM is unable to be visualized due to clotted blood in the left external canal. No mastoid tenderness  or erythema.  Eyes: Pupils are equal, round, and reactive to light. EOM are normal.  Neck: Normal range of motion. Neck supple.  No meningismus  Cardiovascular: Normal rate and regular rhythm.   Pulmonary/Chest: Effort normal. She has wheezes.  inspiratory and expiratory wheezing throughout  Abdominal: Soft. Bowel sounds are normal. There is no tenderness. There is no rebound and no guarding.  Musculoskeletal: Normal range of motion. She exhibits no edema or tenderness.  No lower extremity swelling, asymmetry or tenderness.  Lymphadenopathy:    She has cervical adenopathy.  Neurological: She is alert and oriented to person, place, and time.  Moving all extremities without deficit. Sensation intact.  Skin: Skin is warm and dry. No rash noted. No erythema.  Psychiatric: She has a normal mood and affect. Her behavior is normal.  Nursing note and vitals reviewed.    ED Treatments / Results  Labs (all labs ordered are listed, but only abnormal results are displayed) Labs Reviewed - No data to display  EKG  EKG Interpretation None       Radiology No results found.  Procedures Procedures (including critical care time)  Medications Ordered in ED Medications  albuterol (PROVENTIL) (2.5 MG/3ML) 0.083% nebulizer solution 5 mg (5 mg Nebulization Given 06/05/17 1410)  predniSONE (DELTASONE) tablet 60 mg (60 mg Oral Given 06/05/17 1347)     Initial Impression / Assessment and Plan / ED Course  I have reviewed the triage vital signs and the nursing notes.  Pertinent labs & imaging results that were available during my care of the patient were reviewed by me and considered in my medical decision making (see chart for details).     Place on antibiotics For both dental infection and otitis media. Will refer to outpatient ENT for suspected TM perforation. Return precautions been given.  Final Clinical Impressions(s) / ED Diagnoses   Final diagnoses:  Dental infection  Otitis  media of left ear with spontaneous rupture of tympanic membrane  Exacerbation of intermittent asthma, unspecified asthma severity    New Prescriptions Discharge Medication List as of 06/05/2017  2:47 PM    START taking these medications   Details  amoxicillin-clavulanate (AUGMENTIN) 875-125 MG tablet Take 1 tablet by mouth 2 (two) times daily. One po bid x 7 days, Starting Wed 06/05/2017, Print    fluticasone (FLONASE) 50 MCG/ACT nasal spray Place 2 sprays into both nostrils daily., Starting Wed 06/05/2017, Print    loratadine (CLARITIN) 10 MG tablet Take 1 tablet (10 mg total) by mouth daily., Starting Wed 06/05/2017, Print    predniSONE (DELTASONE) 20 MG tablet 2 tabs po daily x 4 days, Print         Loren Racer, MD 06/07/17 1559

## 2017-06-05 NOTE — ED Triage Notes (Signed)
C/o left lower toothache x 1 week-bleeding from left ear x today-NAD-steady gait

## 2017-08-09 ENCOUNTER — Emergency Department (HOSPITAL_BASED_OUTPATIENT_CLINIC_OR_DEPARTMENT_OTHER)
Admission: EM | Admit: 2017-08-09 | Discharge: 2017-08-09 | Disposition: A | Discharge status: Home / Self Care | Payer: BLUE CROSS/BLUE SHIELD | Attending: Emergency Medicine | Admitting: Emergency Medicine

## 2017-08-09 ENCOUNTER — Encounter (HOSPITAL_BASED_OUTPATIENT_CLINIC_OR_DEPARTMENT_OTHER): Payer: Self-pay | Admitting: *Deleted

## 2017-08-09 ENCOUNTER — Other Ambulatory Visit: Payer: Self-pay

## 2017-08-09 DIAGNOSIS — F17210 Nicotine dependence, cigarettes, uncomplicated: Secondary | ICD-10-CM | POA: Insufficient documentation

## 2017-08-09 DIAGNOSIS — I10 Essential (primary) hypertension: Secondary | ICD-10-CM | POA: Insufficient documentation

## 2017-08-09 DIAGNOSIS — R05 Cough: Secondary | ICD-10-CM | POA: Insufficient documentation

## 2017-08-09 DIAGNOSIS — Z79899 Other long term (current) drug therapy: Secondary | ICD-10-CM | POA: Insufficient documentation

## 2017-08-09 DIAGNOSIS — J45909 Unspecified asthma, uncomplicated: Secondary | ICD-10-CM | POA: Insufficient documentation

## 2017-08-09 DIAGNOSIS — R21 Rash and other nonspecific skin eruption: Secondary | ICD-10-CM

## 2017-08-09 DIAGNOSIS — R070 Pain in throat: Secondary | ICD-10-CM | POA: Insufficient documentation

## 2017-08-09 MED ORDER — CETIRIZINE HCL 10 MG PO TABS: 10.0000 mg | ORAL_TABLET | Freq: Every day | ORAL | 0 refills | Status: AC

## 2017-08-09 MED ORDER — RANITIDINE HCL 150 MG PO CAPS: 150.0000 mg | ORAL_CAPSULE | Freq: Every day | ORAL | 0 refills | Status: AC

## 2017-08-09 NOTE — ED Triage Notes (Signed)
Hives x 2 days. She has been taking PCN x 2 weeks. Vicodin for pain after tooth extraction, Aleve for leg pain and Goodie Powder for leg and back pain. She took Benadryl last night and today.

## 2017-08-09 NOTE — ED Provider Notes (Signed)
[facility] EMERGENCY DEPARTMENT Provider Note   CSN: [id number] Arrival date & time: 08/09/17  1209     History   Chief Complaint Chief Complaint  Patient presents with  . Rash    HPI Kristine Gomez is a 31 y.o. female presenting for evaluation of rash.  Patient states that the rash began yesterday.  It started on her chest, spread to her face, wrist, and feet.  It is pruritic, does not hurt.  She denies drainage from the rash.  She took Benadryl last night and this morning with mild improvement.  She denies new environments, detergents, soaps, shampoos, or closed.  She states she started penicillin 2 weeks ago, but has had it before without side effect.  She is on aspirin and Goody powders, started last week.  She states she got new hair 5 days ago.  She denies sick contacts.  She reports associated sore throat and mild cough that began this morning.  She denies fevers, chills, eye pain, ear pain, chest pain, shortness of breath, nausea, vomiting, abdominal pain, urinary symptoms, normal bowel movements.  She denies tongue swelling, lip swelling, or throat swelling return.  Patient denies difficulty breathing or difficulty handling secretions.  HPI  Past Medical History:  Diagnosis Date  . Asthma   . Bacterial vaginal infection 03/19/2011  . Hypertension   . No pertinent past medical history     Patient Active Problem List   Diagnosis Date Noted  . Bacterial vaginal infection 03/19/2011    Past Surgical History:  Procedure Laterality Date  . BREAST REDUCTION SURGERY     31 YRS OLD  . CESAREAN SECTION      OB History    Gravida Para Term Preterm AB Living   2 0 0 0 1 0   SAB TAB Ectopic Multiple Live Births   1 0 0 0         Home Medications    Prior to Admission medications   Medication Sig Start Date End Date Taking? Authorizing Provider  albuterol (PROVENTIL HFA;VENTOLIN HFA) 108 (90 BASE) MCG/ACT inhaler Inhale 1-2 puffs into the lungs every 6 (six)  hours as needed for wheezing. 04/17/13  Yes Szekalski, Kaitlyn, PA-C  amoxicillin-clavulanate (AUGMENTIN) 875-125 MG tablet Take 1 tablet by mouth 2 (two) times daily. One po bid x 7 days 06/05/17   Loren RacerYelverton, David, MD  cetirizine (ZYRTEC) 10 MG tablet Take 1 tablet (10 mg total) by mouth daily. 08/09/17   Tarez Bowns, PA-C  fluticasone (FLONASE) 50 MCG/ACT nasal spray Place 2 sprays into both nostrils daily. 06/05/17   Loren RacerYelverton, David, MD  loratadine (CLARITIN) 10 MG tablet Take 1 tablet (10 mg total) by mouth daily. 06/05/17   Loren RacerYelverton, David, MD  predniSONE (DELTASONE) 20 MG tablet 2 tabs po daily x 4 days 06/06/17   Loren RacerYelverton, David, MD  ranitidine (ZANTAC) 150 MG capsule Take 1 capsule (150 mg total) by mouth daily. 08/09/17   Demiah Gullickson, PA-C    Family History No family history on file.  Social History Social History   Tobacco Use  . Smoking status: Current Every Day Smoker    Packs/day: 0.33    Types: Cigarettes  . Smokeless tobacco: Never Used  Substance Use Topics  . Alcohol use: Yes    Comment: occ  . Drug use: No     Allergies   Coconut [nuts] and Mushroom extract complex   Review of Systems Review of Systems  HENT: Positive for congestion.   Respiratory:  Positive for cough.   Skin: Positive for rash.     Physical Exam Updated Vital Signs BP (!) 157/109 (BP Location: Right Arm)   Pulse 81   Temp 98.1 F (36.7 C) (Oral)   Resp 16   Ht 5\' 3"  (1.6 m)   Wt 114.3 kg (252 lb)   SpO2 99%   BMI 44.64 kg/m   Physical Exam  Constitutional: She is oriented to person, place, and time. She appears well-developed and well-nourished. No distress.  HENT:  Head: Normocephalic and atraumatic.  Right Ear: Tympanic membrane, external ear and ear canal normal.  Left Ear: Tympanic membrane, external ear and ear canal normal.  Nose: Nose normal.  Mouth/Throat: Uvula is midline, oropharynx is clear and moist and mucous membranes are normal.  OP clear  without tonsillar swelling or exudate.  No tongue, lip, or throat swelling.  Handling secretions easily.  No obvious difficulty breathing  Eyes: EOM are normal.  Neck: Normal range of motion.  Cardiovascular: Normal rate, regular rhythm and intact distal pulses.  Pulmonary/Chest: Effort normal and breath sounds normal. No respiratory distress. She has no wheezes.  Abdominal: She exhibits no distension.  Musculoskeletal: Normal range of motion.  Neurological: She is alert and oriented to person, place, and time.  Skin: Skin is warm. Rash noted.  Papular rash of the chest, face, and feet. No drainage. No lesions on extremities or trunk. No erythema.   Psychiatric: She has a normal mood and affect.  Nursing note and vitals reviewed.    ED Treatments / Results  Labs (all labs ordered are listed, but only abnormal results are displayed) Labs Reviewed - No data to display  EKG  EKG Interpretation None       Radiology No results found.  Procedures Procedures (including critical care time)  Medications Ordered in ED Medications - No data to display   Initial Impression / Assessment and Plan / ED Course  I have reviewed the triage vital signs and the nursing notes.  Pertinent labs & imaging results that were available during my care of the patient were reviewed by me and considered in my medical decision making (see chart for details).     Patient presenting for evaluation of rash.  Physical exam shows pustular rash scattered throughout the body.  No drainage.  No skin sloughing.  Doubt SJS or TEN.  ?contact dermatitis versus viral rash.  Will treat with H1 and H2 blockers for symptom control and have patient follow-up with dermatology if rash does not improve.  At this time, patient appears safe for discharge.  Return precautions given.  Patient states she understands and agrees to plan.   Final Clinical Impressions(s) / ED Diagnoses   Final diagnoses:  Rash    ED  Discharge Orders        Ordered    ranitidine (ZANTAC) 150 MG capsule  Daily     08/09/17 1413    cetirizine (ZYRTEC) 10 MG tablet  Daily     08/09/17 1413       Kyston Gonce, PA-C 08/09/17 1717    Linwood DibblesKnapp, Jon, MD 08/10/17 [phone]

## 2017-08-09 NOTE — Discharge Instructions (Signed)
Take ranitidine and cetirizine daily for the next 5 days. Try to avoid itching and scratching, as this will cause worsening rash. Avoid hot showers, as this will increase your symptoms. Follow-up with the dermatologist for further evaluation of the rash if the medicine does not improve your symptoms. Return to the ER if you develop throat swelling, or lip or tongue swelling.  Return immediately if you have difficulty breathing.

## 2019-03-04 ENCOUNTER — Other Ambulatory Visit: Payer: Self-pay

## 2019-03-04 ENCOUNTER — Encounter (HOSPITAL_BASED_OUTPATIENT_CLINIC_OR_DEPARTMENT_OTHER): Payer: Self-pay | Admitting: *Deleted

## 2019-03-04 ENCOUNTER — Emergency Department (HOSPITAL_BASED_OUTPATIENT_CLINIC_OR_DEPARTMENT_OTHER)
Admission: EM | Admit: 2019-03-04 | Discharge: 2019-03-04 | Disposition: A | Discharge status: Home / Self Care | Payer: No Typology Code available for payment source | Attending: Emergency Medicine | Admitting: Emergency Medicine

## 2019-03-04 ENCOUNTER — Emergency Department (HOSPITAL_BASED_OUTPATIENT_CLINIC_OR_DEPARTMENT_OTHER): Payer: No Typology Code available for payment source

## 2019-03-04 DIAGNOSIS — R0789 Other chest pain: Secondary | ICD-10-CM | POA: Diagnosis not present

## 2019-03-04 DIAGNOSIS — F17210 Nicotine dependence, cigarettes, uncomplicated: Secondary | ICD-10-CM | POA: Insufficient documentation

## 2019-03-04 DIAGNOSIS — J45909 Unspecified asthma, uncomplicated: Secondary | ICD-10-CM | POA: Insufficient documentation

## 2019-03-04 DIAGNOSIS — I10 Essential (primary) hypertension: Secondary | ICD-10-CM | POA: Insufficient documentation

## 2019-03-04 DIAGNOSIS — M7918 Myalgia, other site: Secondary | ICD-10-CM

## 2019-03-04 DIAGNOSIS — M25511 Pain in right shoulder: Secondary | ICD-10-CM | POA: Insufficient documentation

## 2019-03-04 DIAGNOSIS — Z79899 Other long term (current) drug therapy: Secondary | ICD-10-CM | POA: Insufficient documentation

## 2019-03-04 DIAGNOSIS — M79642 Pain in left hand: Secondary | ICD-10-CM | POA: Insufficient documentation

## 2019-03-04 DIAGNOSIS — M79641 Pain in right hand: Secondary | ICD-10-CM | POA: Insufficient documentation

## 2019-03-04 LAB — PREGNANCY, URINE: Preg Test, Ur: NEGATIVE

## 2019-03-04 MED ORDER — HYDROCODONE-ACETAMINOPHEN 5-325 MG PO TABS
1.0000 | ORAL_TABLET | Freq: Once | ORAL | Status: DC
  Filled 2019-03-04: qty 1

## 2019-03-04 MED ORDER — OXYCODONE-ACETAMINOPHEN 5-325 MG PO TABS
1.0000 | ORAL_TABLET | Freq: Once | ORAL | Status: AC
  Administered 2019-03-04: 1 via ORAL
  Filled 2019-03-04: qty 1

## 2019-03-04 MED ORDER — CYCLOBENZAPRINE HCL 10 MG PO TABS: 10.0000 mg | ORAL_TABLET | Freq: Two times a day (BID) | ORAL | 0 refills | Status: AC | PRN
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
## 2019-03-04 NOTE — Discharge Instructions (Addendum)
Recommend tylenol, motrin for pain. Use flexeril as need for muscle spasms but do not use drug or alcohol with this medicine or drive with this medicine.

## 2019-03-04 NOTE — ED Notes (Signed)
Patient transported to X-ray 

## 2019-03-10 ENCOUNTER — Encounter (HOSPITAL_BASED_OUTPATIENT_CLINIC_OR_DEPARTMENT_OTHER): Payer: Self-pay | Admitting: *Deleted

## 2019-03-10 ENCOUNTER — Emergency Department (HOSPITAL_BASED_OUTPATIENT_CLINIC_OR_DEPARTMENT_OTHER)
Admission: EM | Admit: 2019-03-10 | Discharge: 2019-03-10 | Disposition: A | Discharge status: Home / Self Care | Payer: No Typology Code available for payment source | Attending: Emergency Medicine | Admitting: Emergency Medicine

## 2019-03-10 ENCOUNTER — Other Ambulatory Visit: Payer: Self-pay

## 2019-03-10 DIAGNOSIS — I10 Essential (primary) hypertension: Secondary | ICD-10-CM | POA: Diagnosis not present

## 2019-03-10 DIAGNOSIS — L233 Allergic contact dermatitis due to drugs in contact with skin: Secondary | ICD-10-CM | POA: Diagnosis not present

## 2019-03-10 DIAGNOSIS — R21 Rash and other nonspecific skin eruption: Secondary | ICD-10-CM | POA: Diagnosis present

## 2019-03-10 DIAGNOSIS — J45909 Unspecified asthma, uncomplicated: Secondary | ICD-10-CM | POA: Insufficient documentation

## 2019-03-10 DIAGNOSIS — F17210 Nicotine dependence, cigarettes, uncomplicated: Secondary | ICD-10-CM | POA: Insufficient documentation

## 2019-03-10 DIAGNOSIS — Z79899 Other long term (current) drug therapy: Secondary | ICD-10-CM | POA: Diagnosis not present

## 2019-03-10 NOTE — ED Notes (Signed)
ED Provider at bedside. 

## 2019-03-10 NOTE — Discharge Instructions (Addendum)
I think that the left wrist is has the bump secondary to a contact dermatitis.  Stop applying any kind of ointments or any dressing.  Can take Benadryl for the itching as needed.  Return for any new or worse symptoms.  It may take up to a week to clear.
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
